# Patient Record
Sex: Male | Born: 1935 | Race: White | Hispanic: No | Marital: Married | Smoking: Never smoker
Health system: Southern US, Community
[De-identification: ages and names within clinical notes are randomized; demographics above are authoritative.]

## PROBLEM LIST (undated history)

## (undated) DIAGNOSIS — F0280 Dementia in other diseases classified elsewhere without behavioral disturbance: Secondary | ICD-10-CM

## (undated) DIAGNOSIS — E7800 Pure hypercholesterolemia, unspecified: Secondary | ICD-10-CM

## (undated) DIAGNOSIS — G629 Polyneuropathy, unspecified: Secondary | ICD-10-CM

## (undated) DIAGNOSIS — N400 Enlarged prostate without lower urinary tract symptoms: Secondary | ICD-10-CM

---

## 2019-07-15 ENCOUNTER — Ambulatory Visit (INDEPENDENT_AMBULATORY_CARE_PROVIDER_SITE_OTHER): Admit: 2019-07-15 | Discharge: 2019-07-15 | Disposition: A | Discharge status: Home / Self Care | Payer: Medicare HMO

## 2019-07-15 ENCOUNTER — Ambulatory Visit
Admission: EM | Admit: 2019-07-15 | Discharge: 2019-07-15 | Disposition: A | Discharge status: Home / Self Care | Payer: Medicare HMO | Attending: Family Medicine | Admitting: Family Medicine

## 2019-07-15 ENCOUNTER — Other Ambulatory Visit: Payer: Self-pay

## 2019-07-15 ENCOUNTER — Encounter: Payer: Self-pay | Admitting: Emergency Medicine

## 2019-07-15 DIAGNOSIS — S0181XA Laceration without foreign body of other part of head, initial encounter: Secondary | ICD-10-CM | POA: Diagnosis not present

## 2019-07-15 DIAGNOSIS — W19XXXA Unspecified fall, initial encounter: Secondary | ICD-10-CM

## 2019-07-15 DIAGNOSIS — S0083XA Contusion of other part of head, initial encounter: Secondary | ICD-10-CM | POA: Diagnosis not present

## 2019-07-15 DIAGNOSIS — S01111A Laceration without foreign body of right eyelid and periocular area, initial encounter: Secondary | ICD-10-CM

## 2019-07-15 HISTORY — DX: Benign prostatic hyperplasia without lower urinary tract symptoms: N40.0

## 2019-07-15 HISTORY — DX: Polyneuropathy, unspecified: G62.9

## 2019-07-15 HISTORY — DX: Dementia in other diseases classified elsewhere, unspecified severity, without behavioral disturbance, psychotic disturbance, mood disturbance, and anxiety: F02.80

## 2019-07-15 HISTORY — DX: Pure hypercholesterolemia, unspecified: E78.00

## 2019-07-15 MED ORDER — TETANUS-DIPHTH-ACELL PERTUSSIS 5-2.5-18.5 LF-MCG/0.5 IM SUSP
0.5000 mL | Freq: Once | INTRAMUSCULAR | Status: AC
  Administered 2019-07-15: 0.5 mL via INTRAMUSCULAR

## 2019-07-15 MED ORDER — AMOXICILLIN 875 MG PO TABS: 875.0000 mg | ORAL_TABLET | Freq: Two times a day (BID) | ORAL | 0 refills | Status: DC

## 2019-07-15 NOTE — ED Provider Notes (Signed)
[facility]    CSN: [id number] Arrival date & time: 07/15/19  1840      History   Chief Complaint Chief Complaint  Patient presents with   Facial Laceration    HPI Jeffrey Burke is a 84 y.o. male.   84 yo male accompanied by son with a c/o fall this afternoon at home in the driveway hitting his forehead and face and sustaining a laceration to his right eyebrow area and right upper cheek area.  Denies loss of consciousness, vision changes, vomiting, numbness/tingling, unilateral weakness, neck pain.      Past Medical History:  Diagnosis Date   Alzheimer disease (HCC)    BPH (benign prostatic hyperplasia)    Hypercholesteremia    Neuropathy     There are no active problems to display for this patient.   History reviewed. No pertinent surgical history.     Home Medications    Prior to Admission medications   Medication Sig Start Date End Date Taking? Authorizing Provider  doxazosin (CARDURA) 4 MG tablet Take 4 mg by mouth at bedtime. 06/11/19  Yes [provider]  DULoxetine (CYMBALTA) 30 MG capsule Take 30 mg by mouth daily. 03/06/19  Yes [provider]  finasteride (PROSCAR) 5 MG tablet Take 5 mg by mouth daily. 06/11/19  Yes [provider]  gabapentin (NEURONTIN) 100 MG capsule Take 100 mg by mouth at bedtime. 06/11/19  Yes [provider]  lovastatin (MEVACOR) 40 MG tablet Take 40 mg by mouth at bedtime. 06/11/19  Yes [provider]  memantine (NAMENDA) 10 MG tablet Take 10 mg by mouth daily. 06/11/19  Yes [provider]  QUEtiapine (SEROQUEL) 25 MG tablet Take 25 mg by mouth at bedtime. 07/06/19  Yes [provider]  amoxicillin (AMOXIL) 875 MG tablet Take 1 tablet (875 mg total) by mouth 2 (two) times daily. 07/15/19   Payton Mccallumonty, Christianne Zacher, MD    Family History History reviewed. No pertinent family history.  Social History Social History   Tobacco Use   Smoking status: Never Smoker    Smokeless tobacco: Never Used  Substance Use Topics   Alcohol use: Never    Frequency: Never   Drug use: Never     Allergies   Patient has no known allergies.   Review of Systems Review of Systems   Physical Exam Triage Vital Signs ED Triage Vitals  Enc Vitals Group     BP 07/15/19 1904 (!) 161/71     Pulse Rate 07/15/19 1904 72     Resp 07/15/19 1904 18     Temp 07/15/19 1904 98.2 F (36.8 C)     Temp Source 07/15/19 1904 Oral     SpO2 07/15/19 1904 99 %     Weight 07/15/19 1901 155 lb (70.3 kg)     Height 07/15/19 1901 5\' 8"  (1.727 m)     Head Circumference --      Peak Flow --      Pain Score 07/15/19 1900 2     Pain Loc --      Pain Edu? --      Excl. in GC? --    No data found.  Updated Vital Signs BP (!) 161/71 (BP Location: Left Arm)    Pulse 72    Temp 98.2 F (36.8 C) (Oral)    Resp 18    Ht 5\' 8"  (1.727 m)    Wt 70.3 kg    SpO2 99%    BMI 23.57  kg/m   Visual Acuity Right Eye Distance:   Left Eye Distance:   Bilateral Distance:    Right Eye Near:   Left Eye Near:    Bilateral Near:     Physical Exam Vitals signs and nursing note reviewed.  Constitutional:      General: He is not in acute distress.    Appearance: He is not toxic-appearing or diaphoretic.  HENT:     Head: Abrasion (to left forehead and cheek) and laceration present.      Right Ear: Tympanic membrane, ear canal and external ear normal.     Left Ear: Tympanic membrane, ear canal and external ear normal.     Nose: Nose normal.     Mouth/Throat:     Pharynx: Oropharynx is clear.  Eyes:     General: Gaze aligned appropriately.     Extraocular Movements: Extraocular movements intact.     Pupils: Pupils are equal, round, and reactive to light.  Neck:     Musculoskeletal: Normal range of motion and neck supple. No muscular tenderness.  Musculoskeletal:     Right shoulder: Normal.     Left shoulder: Normal.     Comments: Superficial skin abrasions noted to shoulders  bilaterally; extremities neurovascularly intact  Neurological:     General: No focal deficit present.     Mental Status: He is alert. Mental status is at baseline.     Cranial Nerves: Cranial nerves are intact.     Motor: Motor function is intact.     Deep Tendon Reflexes: Reflexes are normal and symmetric.      UC Treatments / Results  Labs (all labs ordered are listed, but only abnormal results are displayed) Labs Reviewed - No data to display  EKG   Radiology Ct Head Wo Contrast  Result Date: 07/15/2019 CLINICAL DATA:  Fall with head and facial trauma. EXAM: CT HEAD WITHOUT CONTRAST CT MAXILLOFACIAL WITHOUT CONTRAST TECHNIQUE: Multidetector CT imaging of the head and maxillofacial structures were performed using the standard protocol without intravenous contrast. Multiplanar CT image reconstructions of the maxillofacial structures were also generated. COMPARISON:  None. FINDINGS: CT HEAD FINDINGS Brain: There is no evidence of acute infarct, intracranial hemorrhage, mass, midline shift, or extra-axial fluid collection. Mild cerebral atrophy is within normal limits for age. Vascular: Calcified atherosclerosis at the skull base. No hyperdense vessel. Skull: No fracture or focal osseous lesion. Other: None. CT MAXILLOFACIAL FINDINGS Osseous: No acute fracture or mandibular dislocation. Moderately advanced median C1-2 arthropathy. Moderate to severe multilevel cervical facet arthrosis with trace anterolisthesis of C5 on C6. Left facet ankylosis at C2-3. Orbits: Bilateral cataract extraction. Small right periorbital hematoma. No retrobulbar hematoma. Sinuses: Paranasal sinuses and mastoid air cells are clear. Soft tissues: Mild carotid atherosclerosis. IMPRESSION: 1. No evidence of acute intracranial abnormality. 2. Small right periorbital hematoma. No maxillofacial fracture. Electronically Signed   By: Sebastian Ache M.D.   On: 07/15/2019 20:27   Ct Maxillofacial Wo Contrast  Result Date:  07/15/2019 CLINICAL DATA:  Fall with head and facial trauma. EXAM: CT HEAD WITHOUT CONTRAST CT MAXILLOFACIAL WITHOUT CONTRAST TECHNIQUE: Multidetector CT imaging of the head and maxillofacial structures were performed using the standard protocol without intravenous contrast. Multiplanar CT image reconstructions of the maxillofacial structures were also generated. COMPARISON:  None. FINDINGS: CT HEAD FINDINGS Brain: There is no evidence of acute infarct, intracranial hemorrhage, mass, midline shift, or extra-axial fluid collection. Mild cerebral atrophy is within normal limits for age. Vascular: Calcified atherosclerosis at the skull  base. No hyperdense vessel. Skull: No fracture or focal osseous lesion. Other: None. CT MAXILLOFACIAL FINDINGS Osseous: No acute fracture or mandibular dislocation. Moderately advanced median C1-2 arthropathy. Moderate to severe multilevel cervical facet arthrosis with trace anterolisthesis of C5 on C6. Left facet ankylosis at C2-3. Orbits: Bilateral cataract extraction. Small right periorbital hematoma. No retrobulbar hematoma. Sinuses: Paranasal sinuses and mastoid air cells are clear. Soft tissues: Mild carotid atherosclerosis. IMPRESSION: 1. No evidence of acute intracranial abnormality. 2. Small right periorbital hematoma. No maxillofacial fracture. Electronically Signed   By: Logan Bores M.D.   On: 07/15/2019 20:27    Procedures Laceration Repair  Date/Time: 07/15/2019 7:55 PM Performed by: Norval Gable, MD Authorized by: Norval Gable, MD   Consent:    Consent obtained:  Verbal   Consent given by:  Patient   Risks discussed:  Infection, need for additional repair, nerve damage, poor wound healing, poor cosmetic result, pain, retained foreign body, tendon damage and vascular damage   Alternatives discussed:  No treatment Anesthesia (see MAR for exact dosages):    Anesthesia method:  Topical application and local infiltration   Topical anesthetic:  LET   Local  anesthetic:  Lidocaine 1% w/o epi Laceration details:    Location:  Face   Face location:  R eyebrow (right eyebrow and right upper lateral cheek)   Length (cm):  1 Repair type:    Repair type:  Simple Pre-procedure details:    Preparation:  Patient was prepped and draped in usual sterile fashion Exploration:    Hemostasis achieved with:  LET and direct pressure   Wound exploration: wound explored through full range of motion and entire depth of wound probed and visualized     Wound extent: areolar tissue violated     Contaminated: yes   Treatment:    Area cleansed with:  Saline and Hibiclens   Amount of cleaning:  Extensive   Irrigation solution:  Sterile water   Irrigation method:  Syringe   Visualized foreign bodies/material removed: yes   Skin repair:    Repair method:  Sutures   Suture size:  6-0   Suture material:  Nylon   Number of sutures:  5 Approximation:    Approximation:  Close Post-procedure details:    Dressing:  Antibiotic ointment and non-adherent dressing   Patient tolerance of procedure:  Tolerated well, no immediate complications   (including critical care time)  Medications Ordered in UC Medications  Tdap (BOOSTRIX) injection 0.5 mL (0.5 mLs Intramuscular Given 07/15/19 1912)    Initial Impression / Assessment and Plan / UC Course  I have reviewed the triage vital signs and the nursing notes.  Pertinent labs & imaging results that were available during my care of the patient were reviewed by me and considered in my medical decision making (see chart for details).      Final Clinical Impressions(s) / UC Diagnoses   Final diagnoses:  Contusion of face, initial encounter  Laceration of right eyebrow, initial encounter  Facial laceration, initial encounter  Fall, initial encounter     Discharge Instructions     Follow in 7 days for suture removal or sooner as needed if any problems    ED Prescriptions    Medication Sig Dispense Auth.  Provider   amoxicillin (AMOXIL) 875 MG tablet Take 1 tablet (875 mg total) by mouth 2 (two) times daily. 14 tablet Norval Gable, MD     1. x-ray results and diagnosis reviewed with patient and son 2. rx  as per orders above; reviewed possible side effects, interactions, risks and benefits  3. Recommend supportive treatment with routine wound, rest, otc analgesics as needed 4. Tetanus vaccine given 5. Follow-up in 7 days for suture removal or sooner prn if any problems   PDMP not reviewed this encounter.   Payton Mccallum, MD 07/15/19 2051

## 2019-07-15 NOTE — ED Triage Notes (Addendum)
Patient c/o falling this afternoon around 3pm in the driveway. He hit his face. Laceration to right upper eye x 2, right upper cheek. He also has bruising to his left cheek and right eye area. He denies LOC. Patient has 4 skin tears to his left arm. Patient also has abrasion to his left shoulder and c/o pain in the shoulder area. He also has an abrasion to his upper right shoulder.

## 2019-07-15 NOTE — Discharge Instructions (Signed)
Follow in 7 days for suture removal or sooner as needed if any problems

## 2019-07-21 ENCOUNTER — Telehealth: Payer: Self-pay | Admitting: Emergency Medicine

## 2019-07-21 DIAGNOSIS — I10 Essential (primary) hypertension: Secondary | ICD-10-CM | POA: Diagnosis present

## 2019-07-21 NOTE — Telephone Encounter (Signed)
Prior Authorization for CT Maxillofacial and CT Brain/Head is [id number]

## 2019-07-22 ENCOUNTER — Ambulatory Visit
Admission: EM | Admit: 2019-07-22 | Discharge: 2019-07-22 | Disposition: A | Discharge status: Home / Self Care | Payer: Medicare HMO | Attending: Family Medicine | Admitting: Family Medicine

## 2019-07-22 ENCOUNTER — Other Ambulatory Visit: Payer: Self-pay

## 2019-07-22 DIAGNOSIS — Z4802 Encounter for removal of sutures: Secondary | ICD-10-CM | POA: Diagnosis not present

## 2019-07-22 NOTE — ED Triage Notes (Signed)
Pt. States he was told to come back in a week to get his suture removed. He has no complaints today.

## 2019-07-22 NOTE — ED Provider Notes (Signed)
[facility] ____________________________________________  Time seen: Approximately 3:39 PM  I have reviewed the triage vital signs and the nursing notes.   HISTORY  Chief Complaint Suture / Staple Removal   HPI Jeffrey Burke is a 84 y.o. male presenting with son at bedside for evaluation for suture removal.  Patient was seen in urgent care on 07/15/19 and had 5 stitches placed to your face, 3 in eyebrow and 2 to cheek.  Denies fevers.  Reports healing well, no drainage, denies pain.  States here for suture removal only.   Past Medical History:  Diagnosis Date  . Alzheimer disease (HCC)   . BPH (benign prostatic hyperplasia)   . Hypercholesteremia   . Neuropathy     There are no active problems to display for this patient.   History reviewed. No pertinent surgical history.   No current facility-administered medications for this encounter.   Current Outpatient Medications:  .  amoxicillin (AMOXIL) 875 MG tablet, Take 1 tablet (875 mg total) by mouth 2 (two) times daily., Disp: 14 tablet, Rfl: 0 .  doxazosin (CARDURA) 4 MG tablet, Take 4 mg by mouth at bedtime., Disp: , Rfl:  .  DULoxetine (CYMBALTA) 30 MG capsule, Take 30 mg by mouth daily., Disp: , Rfl:  .  finasteride (PROSCAR) 5 MG tablet, Take 5 mg by mouth daily., Disp: , Rfl:  .  gabapentin (NEURONTIN) 100 MG capsule, Take 100 mg by mouth at bedtime., Disp: , Rfl:  .  lovastatin (MEVACOR) 40 MG tablet, Take 40 mg by mouth at bedtime., Disp: , Rfl:  .  memantine (NAMENDA) 10 MG tablet, Take 10 mg by mouth daily., Disp: , Rfl:  .  QUEtiapine (SEROQUEL) 25 MG tablet, Take 25 mg by mouth at bedtime., Disp: , Rfl:   Allergies Patient has no known allergies.  Family History  Problem Relation Age of Onset  . Stroke Mother   . Heart failure Father     Social History Social History   Tobacco Use  . Smoking status: Never Smoker  . Smokeless tobacco: Never Used  Substance Use Topics  . Alcohol use:  Never    Frequency: Never  . Drug use: Never    Review of Systems Constitutional: No fever/chills Skin: Suture removal. ____________________________________________   PHYSICAL EXAM:  VITAL SIGNS: ED Triage Vitals  Enc Vitals Group     BP 07/22/19 1514 (!) 135/104     Pulse Rate 07/22/19 1514 75     Resp 07/22/19 1514 17     Temp 07/22/19 1514 98 F (36.7 C)     Temp Source 07/22/19 1514 Oral     SpO2 07/22/19 1514 98 %     Weight 07/22/19 1513 155 lb (70.3 kg)     Height --      Head Circumference --      Peak Flow --      Pain Score 07/22/19 1513 0     Pain Loc --      Pain Edu? --      Excl. in GC? --     Constitutional: Alert and oriented. Well appearing and in no acute distress. Eyes: Conjunctivae are normal.  Musculoskeletal: Steady gait. Neurologic:  Normal speech and language.  Skin:  Skin is warm, dry.  Except: Right eyebrow 3 sutures present with well approximated wound, no erythema, no drainage.  Right cheek 2 sutures present with well approximated wound, no erythema, no drainage. Psychiatric: Mood and affect are normal. Speech and behavior are  normal. Patient exhibits appropriate insight and judgment   ___________________________________________   LABS (all labs ordered are listed, but only abnormal results are displayed)  Labs Reviewed - No data to display  PROCEDURES Procedures   Suture removal Procedure explained and consent obtained by patient. 5 total sutures removed by myself after cleaning with Betadine.  Wound well approximated.  No dehiscence.  Patient tolerated well.  INITIAL IMPRESSION / ASSESSMENT AND PLAN / ED COURSE  Pertinent labs & imaging results that were available during my care of the patient were reviewed by me and considered in my medical decision making (see chart for details).  Well-appearing patient.  Suture removal.  Sutures removed.  Continue to monitor and supportive care.   ____________________________________________   FINAL CLINICAL IMPRESSION(S) / ED DIAGNOSES  Final diagnoses:  Visit for suture removal     ED Discharge Orders    None       Note: This dictation was prepared with Dragon dictation along with smaller phrase technology. Any transcriptional errors that result from this process are unintentional.         Marylene Land, NP 07/22/19 1543

## 2019-07-22 NOTE — ED Notes (Signed)
5 sutures removed from patients forehead by Mendel Ryder NP.

## 2020-05-09 DIAGNOSIS — H903 Sensorineural hearing loss, bilateral: Secondary | ICD-10-CM | POA: Diagnosis present

## 2021-02-23 IMAGING — CT CT HEAD W/O CM
1 series · 15 of 30 positions shown, 19 images · non-contrast
Comparison: None.

CLINICAL DATA: Fall with head and facial trauma.

EXAM:
CT HEAD WITHOUT CONTRAST
CT MAXILLOFACIAL WITHOUT CONTRAST
TECHNIQUE: Multidetector CT imaging of the head and maxillofacial structures
were performed using the standard protocol without intravenous
contrast. Multiplanar CT image reconstructions of the maxillofacial
structures were also generated.

[Series 2: head wo · axial · 0.49mm/px · z∈[-212,-67]mm · 15 of 33 slices shown, 19 images]
[im 2/33  brain]
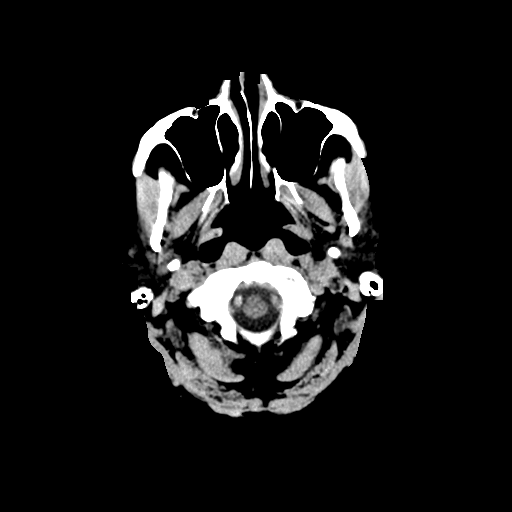
[im 2/33  bone]
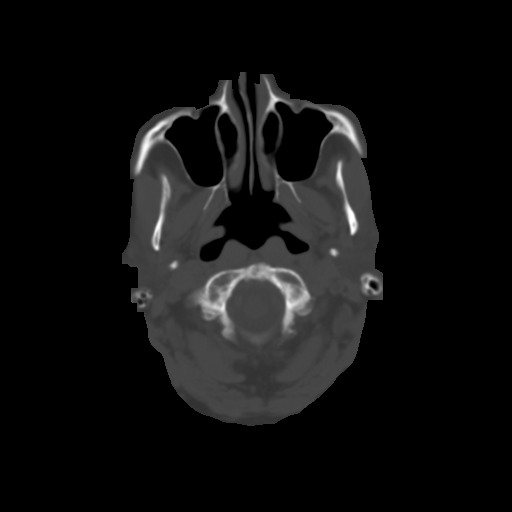
[im 4/33  brain]
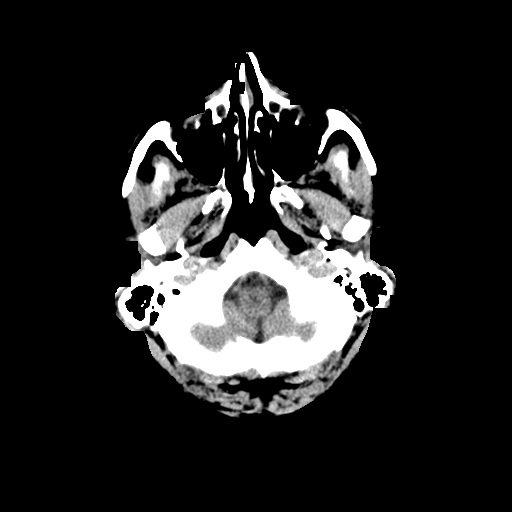
[im 6/33  brain]
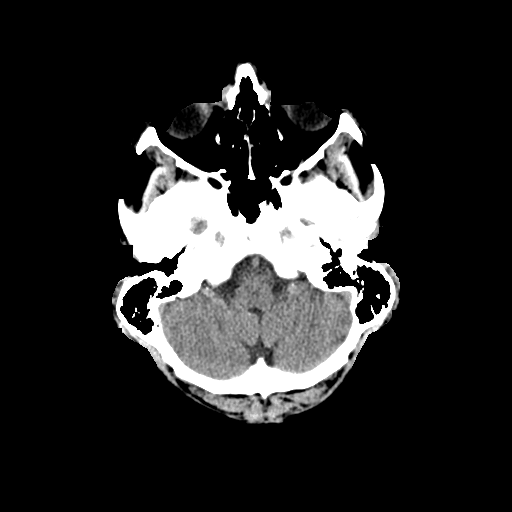
[im 8/33  brain]
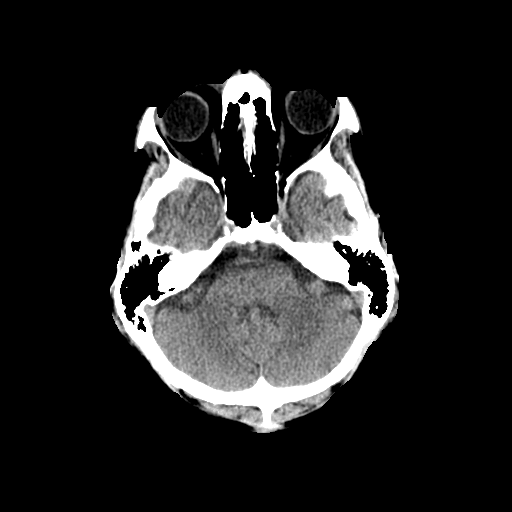
[im 10/33  brain]
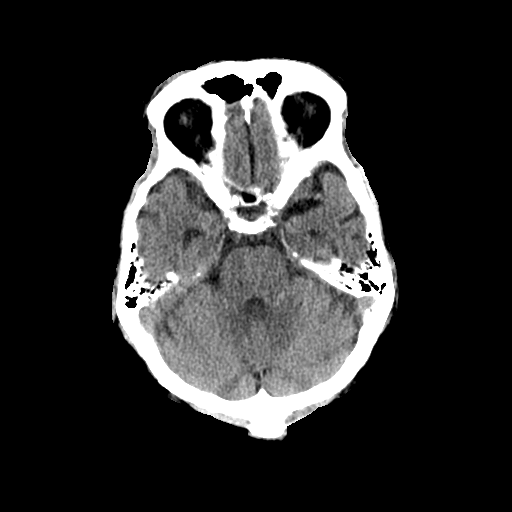
[im 10/33  bone]
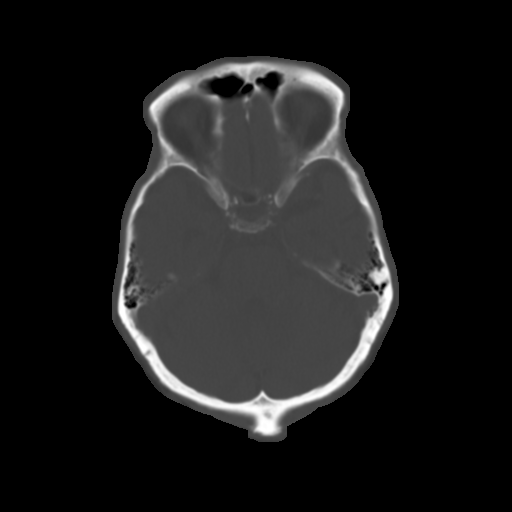
[im 13/33  brain]
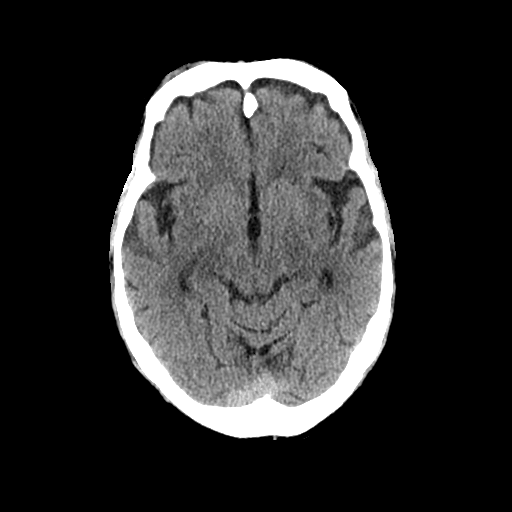
[im 15/33  brain]
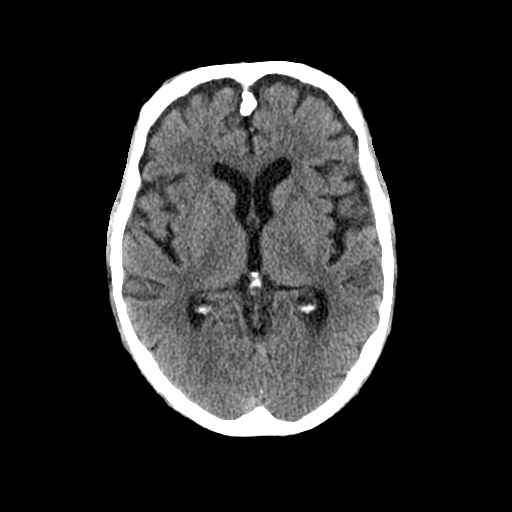
[im 17/33  brain]
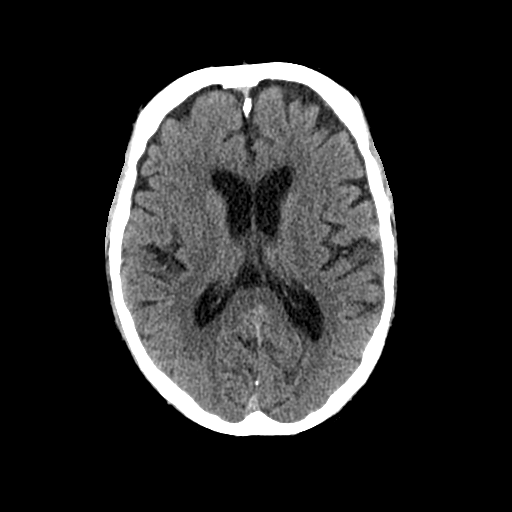
[im 18/33  brain]
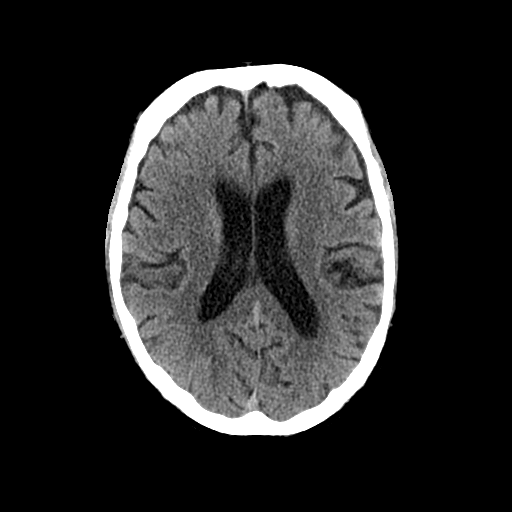
[im 18/33  bone]
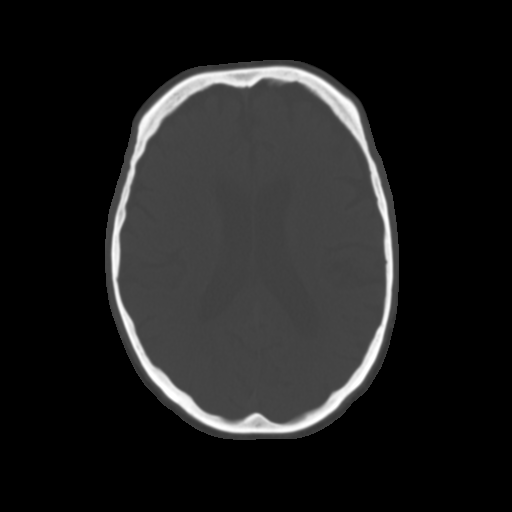
[im 20/33  brain]
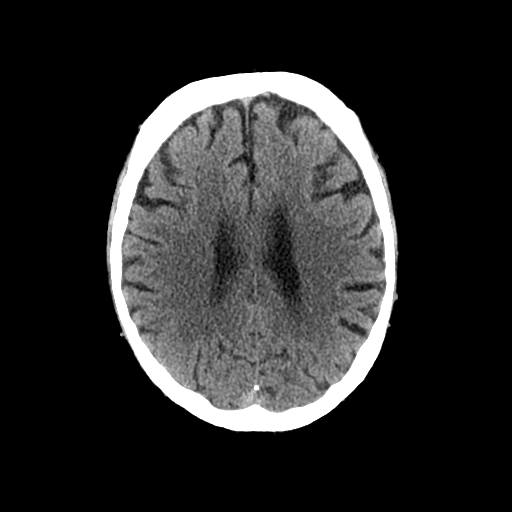
[im 23/33  brain]
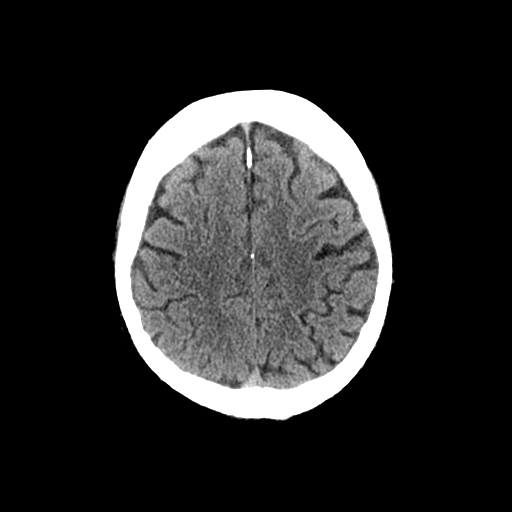
[im 25/33  brain]
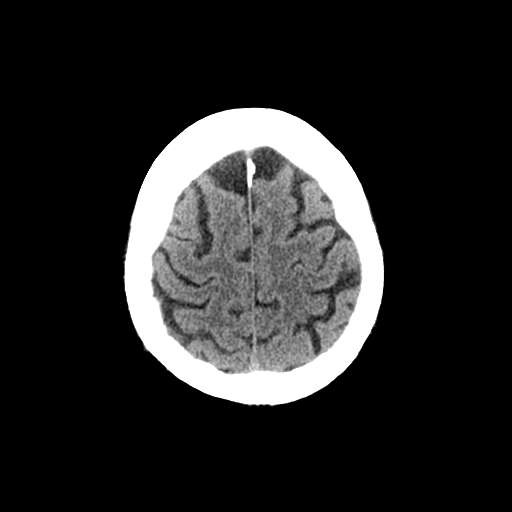
[im 27/33  brain]
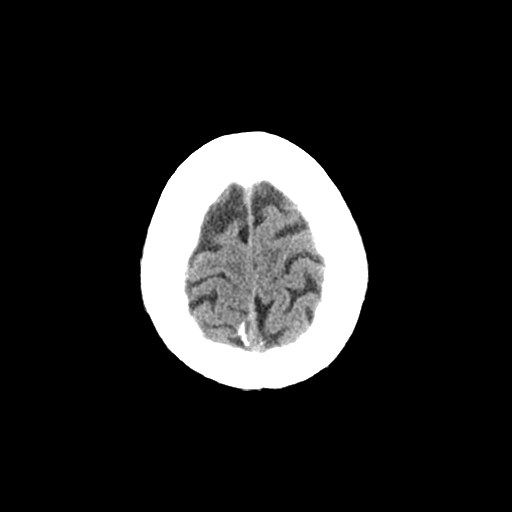
[im 27/33  bone]
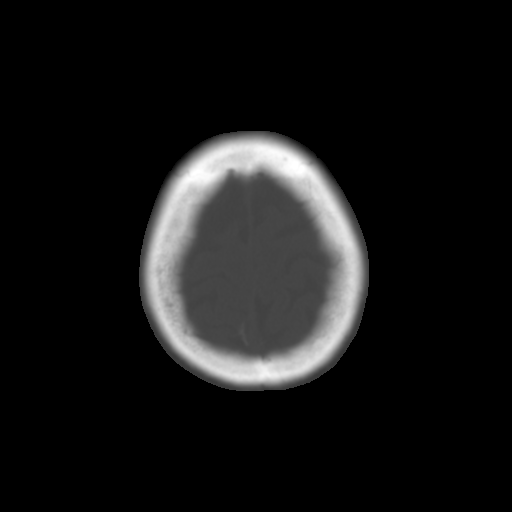
[im 29/33  brain]
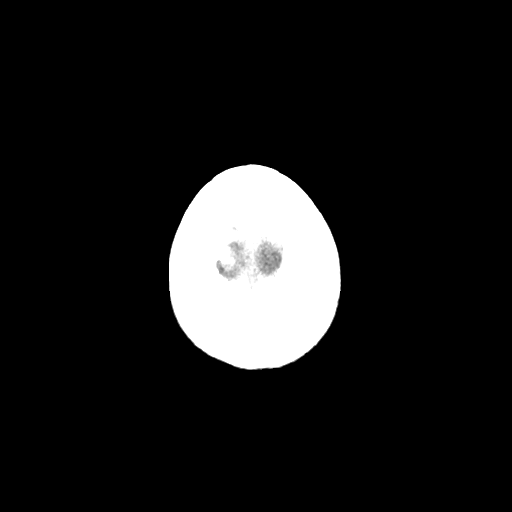
[im 31/33  brain]
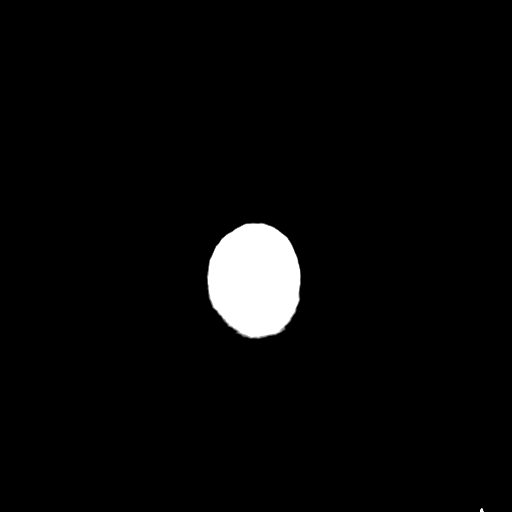

[15 of 30 positions shown; findings below may reference images not displayed]

FINDINGS: CT HEAD FINDINGS

Brain: There is no evidence of acute infarct, intracranial
hemorrhage, mass, midline shift, or extra-axial fluid collection.
Mild cerebral atrophy is within normal limits for age.

Vascular: Calcified atherosclerosis at the skull base. No hyperdense
vessel.

Skull: No fracture or focal osseous lesion.

Other: None.

CT MAXILLOFACIAL FINDINGS

Osseous: No acute fracture or mandibular dislocation. Moderately
advanced median C1-2 arthropathy. Moderate to severe multilevel
cervical facet arthrosis with trace anterolisthesis of C5 on C6.
Left facet ankylosis at C2-3.

Orbits: Bilateral cataract extraction. Small right periorbital
hematoma. No retrobulbar hematoma.

Sinuses: Paranasal sinuses and mastoid air cells are clear.

Soft tissues: Mild carotid atherosclerosis.
IMPRESSION: 1. No evidence of acute intracranial abnormality.
2. Small right periorbital hematoma. No maxillofacial fracture.

## 2023-01-30 ENCOUNTER — Emergency Department: Payer: Medicare HMO

## 2023-01-30 ENCOUNTER — Other Ambulatory Visit: Payer: Self-pay

## 2023-01-30 ENCOUNTER — Inpatient Hospital Stay
Admission: EM | Admit: 2023-01-30 | Discharge: 2023-02-02 | DRG: 177 | Disposition: A | Discharge status: Home Health | Payer: Medicare HMO | Attending: Obstetrics and Gynecology | Admitting: Obstetrics and Gynecology

## 2023-01-30 DIAGNOSIS — U071 COVID-19: Principal | ICD-10-CM

## 2023-01-30 DIAGNOSIS — G309 Alzheimer's disease, unspecified: Secondary | ICD-10-CM | POA: Diagnosis present

## 2023-01-30 DIAGNOSIS — R310 Gross hematuria: Secondary | ICD-10-CM | POA: Insufficient documentation

## 2023-01-30 DIAGNOSIS — N401 Enlarged prostate with lower urinary tract symptoms: Secondary | ICD-10-CM | POA: Diagnosis present

## 2023-01-30 DIAGNOSIS — R531 Weakness: Principal | ICD-10-CM

## 2023-01-30 DIAGNOSIS — Z8249 Family history of ischemic heart disease and other diseases of the circulatory system: Secondary | ICD-10-CM

## 2023-01-30 DIAGNOSIS — N179 Acute kidney failure, unspecified: Secondary | ICD-10-CM | POA: Insufficient documentation

## 2023-01-30 DIAGNOSIS — H903 Sensorineural hearing loss, bilateral: Secondary | ICD-10-CM | POA: Diagnosis present

## 2023-01-30 DIAGNOSIS — Z823 Family history of stroke: Secondary | ICD-10-CM

## 2023-01-30 DIAGNOSIS — I10 Essential (primary) hypertension: Secondary | ICD-10-CM | POA: Diagnosis present

## 2023-01-30 DIAGNOSIS — E7800 Pure hypercholesterolemia, unspecified: Secondary | ICD-10-CM | POA: Diagnosis present

## 2023-01-30 DIAGNOSIS — F0280 Dementia in other diseases classified elsewhere without behavioral disturbance: Secondary | ICD-10-CM | POA: Insufficient documentation

## 2023-01-30 DIAGNOSIS — R338 Other retention of urine: Secondary | ICD-10-CM

## 2023-01-30 DIAGNOSIS — Z79899 Other long term (current) drug therapy: Secondary | ICD-10-CM

## 2023-01-30 DIAGNOSIS — N400 Enlarged prostate without lower urinary tract symptoms: Secondary | ICD-10-CM | POA: Insufficient documentation

## 2023-01-30 DIAGNOSIS — E860 Dehydration: Secondary | ICD-10-CM | POA: Diagnosis present

## 2023-01-30 DIAGNOSIS — G9341 Metabolic encephalopathy: Secondary | ICD-10-CM

## 2023-01-30 DIAGNOSIS — G629 Polyneuropathy, unspecified: Secondary | ICD-10-CM | POA: Diagnosis present

## 2023-01-30 LAB — PROTIME-INR
INR: 1.1 (ref 0.8–1.2)
Prothrombin Time: 14 seconds (ref 11.4–15.2)

## 2023-01-30 LAB — DIFFERENTIAL
Abs Immature Granulocytes: 0.03 10*3/uL (ref 0.00–0.07)
Basophils Absolute: 0 10*3/uL (ref 0.0–0.1)
Basophils Relative: 1 %
Eosinophils Absolute: 0.1 10*3/uL (ref 0.0–0.5)
Eosinophils Relative: 1 %
Immature Granulocytes: 1 %
Lymphocytes Relative: 10 %
Lymphs Abs: 0.6 10*3/uL — ABNORMAL LOW (ref 0.7–4.0)
Monocytes Absolute: 0.9 10*3/uL (ref 0.1–1.0)
Monocytes Relative: 15 %
Neutro Abs: 4.3 10*3/uL (ref 1.7–7.7)
Neutrophils Relative %: 72 %

## 2023-01-30 LAB — CBC
HCT: 40.3 % (ref 39.0–52.0)
Hemoglobin: 13.2 g/dL (ref 13.0–17.0)
MCH: 29.7 pg (ref 26.0–34.0)
MCHC: 32.8 g/dL (ref 30.0–36.0)
MCV: 90.8 fL (ref 80.0–100.0)
Platelets: 175 10*3/uL (ref 150–400)
RBC: 4.44 MIL/uL (ref 4.22–5.81)
RDW: 12.5 % (ref 11.5–15.5)
WBC: 6 10*3/uL (ref 4.0–10.5)
nRBC: 0 % (ref 0.0–0.2)

## 2023-01-30 LAB — CBG MONITORING, ED: Glucose-Capillary: 93 mg/dL (ref 70–99)

## 2023-01-30 LAB — COMPREHENSIVE METABOLIC PANEL
ALT: 17 U/L (ref 0–44)
AST: 22 U/L (ref 15–41)
Albumin: 3.9 g/dL (ref 3.5–5.0)
Alkaline Phosphatase: 60 U/L (ref 38–126)
Anion gap: 6 (ref 5–15)
BUN: 25 mg/dL — ABNORMAL HIGH (ref 8–23)
CO2: 23 mmol/L (ref 22–32)
Calcium: 7.9 mg/dL — ABNORMAL LOW (ref 8.9–10.3)
Chloride: 105 mmol/L (ref 98–111)
Creatinine, Ser: 1.26 mg/dL — ABNORMAL HIGH (ref 0.61–1.24)
GFR, Estimated: 56 mL/min — ABNORMAL LOW (ref >60)
Glucose, Bld: 95 mg/dL (ref 70–99)
Potassium: 4 mmol/L (ref 3.5–5.1)
Sodium: 134 mmol/L — ABNORMAL LOW (ref 135–145)
Total Bilirubin: 0.7 mg/dL (ref 0.3–1.2)
Total Protein: 6.4 g/dL — ABNORMAL LOW (ref 6.5–8.1)

## 2023-01-30 LAB — APTT: aPTT: 29 seconds (ref 24–36)

## 2023-01-30 LAB — ETHANOL: Alcohol, Ethyl (B): <10 mg/dL (ref <10)

## 2023-01-30 MED ORDER — SODIUM CHLORIDE 0.9% FLUSH
3.0000 mL | Freq: Once | INTRAVENOUS | Status: AC
  Administered 2023-01-30: 3 mL via INTRAVENOUS

## 2023-01-30 MED ORDER — LACTATED RINGERS IV BOLUS
1000.0000 mL | Freq: Once | INTRAVENOUS | Status: AC
  Administered 2023-01-30: 1000 mL via INTRAVENOUS

## 2023-01-30 NOTE — ED Provider Notes (Signed)
[facility] Provider Note    Event Date/Time   First MD Initiated Contact with Patient 01/30/23 2309     (approximate)   History   Weakness   HPI  Jeffrey Burke is a 88 y.o. male past medical history of Alzheimer's dementia, BPH, hyperlipidemia who presents with weakness.  Patient is accompanied by his son who provides most of the history.  Apparently at baseline patient is ambulatory and last week was jogging.  This morning was speed walking around the house.  However this afternoon his aide noticed that he was not able to get up from his chair.  He also did not eat lunch.  When his son got home he was sitting in his chair and did not notice anything abnormal but when he tried to get him up he was very shaky not able to ambulate is not typical for him.  Does seem to be somewhat more confused than normal but his mental status does wax and wane.  Has not had any fevers chills.  Has not had much urine output today per his aide.  Does have a chronic cough this is not new today.  Patient denies any complaints.  Patient's son denies any difficulty with speech.     Past Medical History:  Diagnosis Date   Alzheimer disease (HCC)    BPH (benign prostatic hyperplasia)    Hypercholesteremia    Neuropathy     There are no problems to display for this patient.    Physical Exam  Triage Vital Signs: ED Triage Vitals  Enc Vitals Group     BP 01/30/23 1655 (!) 118/57     Pulse Rate 01/30/23 1655 84     Resp 01/30/23 1655 18     Temp 01/30/23 1658 98.7 F (37.1 C)     Temp Source 01/30/23 2252 Oral     SpO2 01/30/23 1657 95 %     Weight 01/30/23 1700 160 lb (72.6 kg)     Height 01/30/23 1700 5\' 7"  (1.702 m)     Head Circumference --      Peak Flow --      Pain Score 01/30/23 1700 0     Pain Loc --      Pain Edu? --      Excl. in GC? --     Most recent vital signs: Vitals:   01/31/23 0100 01/31/23 0230  BP: (!) 134/59 133/62  Pulse: 84 98  Resp: (!)  23 (!) 30  Temp:  99.1 F (37.3 C)  SpO2: 95% 93%     General: Awake, no distress.  CV:  Good peripheral perfusion.  Resp:  Normal effort.  Abd:  No distention.  Neuro:             Awake, Alert, Oriented x 1-patient did not know place or year Aox3, nml speech  PERRL, EOMI, face symmetric, nml tongue movement  5/5 strength in the BL upper and lower extremities  Sensation grossly intact in the BL upper and lower extremities  Patient unable to follow commands for finger-nose Patient requires assistance with ambulation because he is somewhat unsteady but is not grossly ataxic  Other:     ED Results / Procedures / Treatments  Labs (all labs ordered are listed, but only abnormal results are displayed) Labs Reviewed  RESP PANEL BY RT-PCR (RSV, FLU A&B, COVID)  RVPGX2 - Abnormal; Notable for the following components:      Result Value   SARS  Coronavirus 2 by RT PCR POSITIVE (*)    All other components within normal limits  DIFFERENTIAL - Abnormal; Notable for the following components:   Lymphs Abs 0.6 (*)    All other components within normal limits  COMPREHENSIVE METABOLIC PANEL - Abnormal; Notable for the following components:   Sodium 134 (*)    BUN 25 (*)    Creatinine, Ser 1.26 (*)    Calcium 7.9 (*)    Total Protein 6.4 (*)    GFR, Estimated 56 (*)    All other components within normal limits  URINALYSIS, ROUTINE W REFLEX MICROSCOPIC - Abnormal; Notable for the following components:   Color, Urine YELLOW (*)    APPearance CLEAR (*)    Hgb urine dipstick LARGE (*)    All other components within normal limits  CULTURE, BLOOD (ROUTINE X 2)  CULTURE, BLOOD (ROUTINE X 2)  URINE CULTURE  PROTIME-INR  APTT  CBC  ETHANOL  LACTIC ACID, PLASMA  PROCALCITONIN  LACTIC ACID, PLASMA  CBG MONITORING, ED  CBG MONITORING, ED     EKG  EKG reviewed interpreted myself shows sinus rhythm with rightward axis significant artifact limits interpretation, nonspecific ST  changes   RADIOLOGY I reviewed and interpreted the CT scan of the brain which does not show any acute intracranial process    PROCEDURES:  Critical Care performed: No  Procedures  The patient is on the cardiac monitor to evaluate for evidence of arrhythmia and/or significant heart rate changes.   MEDICATIONS ORDERED IN ED: Medications  sodium chloride flush (NS) 0.9 % injection 3 mL (3 mLs Intravenous Given 01/30/23 2313)  lactated ringers bolus 1,000 mL (0 mLs Intravenous Stopped 01/31/23 0051)     IMPRESSION / MDM / ASSESSMENT AND PLAN / ED COURSE  I reviewed the triage vital signs and the nursing notes.                              Patient's presentation is most consistent with acute presentation with potential threat to life or bodily function.  Differential diagnosis includes, but is not limited to, metabolic encephalopathy, infection such as UTI, pneumonia, viral illness, acute ischemic CVA, intracranial hemorrhage, medication side effect  The patient is a 88 year old male with history of dementia who presents today because of weakness and inability to ambulate.  His son is with him who notes that this morning he was quite ambulatory and in fact was speed walking around the house.  However sometime this afternoon his aide noticed that he was not able to get up from his chair and when patient's son got home and tried to get him out of the chair he was very shaky and unable to ambulate which is very new for him.  Has also not eaten lunch today and has not urinated since noon.  Patient's vital signs are notable for temp of 99.7.  Otherwise vitals are reassuring.  He looks well is nontoxic appearing.  He is alert and oriented x 1 which his son says this is baseline.  Neurologic exam appears nonfocal he has equal strength in bilateral upper and lower extremities face is symmetric cranial nerves are intact.  Did attempt to ambulate the patient and he is able to stand up with  assistance is somewhat unsteady but not grossly ataxic.  He was not able to follow commands for finger-nose-finger.  Labs are overall reassuring.  Creatinine 1.26 which is not far off his  baseline of around 1.  No leukocytosis.  CT head is negative for acute abnormality.  Overall my suspicion for acute CVA is low given the lack of focality on the exam.  Given his low-grade temp and decreased urine output we will give a bolus of fluid and check urinalysis.  If the urine is not suggestive of infection then I think you will likely need an MRI.   Rectal temp is notable for fever of 100.7.  Bladder scan showed 450 cc of urine in the bladder.  Foley catheter was placed as patient was not able to urinate.  Initially there was significant hematuria when nursing attempted to place Foley.  On reattempt a small catheter they were able to pass catheter.  Urinalysis is greater than 50 red cells suspect this was related to traumatic Foley catheter placement.  There is no white cells.  I have sent a culture.  Lactate is negative.  Procalcitonin is also normal.  He is COVID-positive.  Chest x-ray is clear he is not hypoxic.  I suspect that his viral infection caused his acute weakness today.  Given he is not able to ambulate patient's family does not feel comfortable with him going home and I agree that he would benefit from observation and physical therapy.     FINAL CLINICAL IMPRESSION(S) / ED DIAGNOSES   Final diagnoses:  Weakness  COVID-19     Rx / DC Orders   ED Discharge Orders     None        Note:  This document was prepared using Dragon voice recognition software and may include unintentional dictation errors.   Georga Hacking, MD 01/31/23 [id number]

## 2023-01-30 NOTE — ED Provider Notes (Incomplete)
[facility] Provider Note    Event Date/Time   First MD Initiated Contact with Patient 01/30/23 2309     (approximate)   History   Weakness   HPI  Jeffrey Burke is a 88 y.o. male past medical history of Alzheimer's dementia, BPH, hyperlipidemia who presents with weakness.  Patient is accompanied by his son who provides most of the history.  Apparently at baseline patient is ambulatory and last week was jogging.  This morning was speed walking around the house.  However this afternoon his aide noticed that he was not able to get up from his chair.  He also did not eat lunch.  When his son got home he was sitting in his chair and did not notice anything abnormal but when he tried to get him up he was very shaky not able to ambulate is not typical for him.  Does seem to be somewhat more confused than normal but his mental status does wax and wane.  Has not had any fevers chills.  Has not had much urine output today per his aide.  Does have a chronic cough this is not new today.  Patient denies any complaints.  Patient's son denies any difficulty with speech.     Past Medical History:  Diagnosis Date  . Alzheimer disease (HCC)   . BPH (benign prostatic hyperplasia)   . Hypercholesteremia   . Neuropathy     There are no problems to display for this patient.    Physical Exam  Triage Vital Signs: ED Triage Vitals  Enc Vitals Group     BP 01/30/23 1655 (!) 118/57     Pulse Rate 01/30/23 1655 84     Resp 01/30/23 1655 18     Temp 01/30/23 1658 98.7 F (37.1 C)     Temp Source 01/30/23 2252 Oral     SpO2 01/30/23 1657 95 %     Weight 01/30/23 1700 160 lb (72.6 kg)     Height 01/30/23 1700 5\' 7"  (1.702 m)     Head Circumference --      Peak Flow --      Pain Score 01/30/23 1700 0     Pain Loc --      Pain Edu? --      Excl. in GC? --     Most recent vital signs: Vitals:   01/30/23 1658 01/30/23 2252  BP:  136/62  Pulse:  81  Resp:  20  Temp:  98.7 F (37.1 C) 99.7 F (37.6 C)  SpO2:  98%     General: Awake, no distress.  CV:  Good peripheral perfusion.  Resp:  Normal effort.  Abd:  No distention.  Neuro:             Awake, Alert, Oriented x 1-patient did not know place or year Aox3, nml speech  PERRL, EOMI, face symmetric, nml tongue movement  5/5 strength in the BL upper and lower extremities  Sensation grossly intact in the BL upper and lower extremities  Patient unable to follow commands for finger-nose Patient requires assistance with ambulation because he is somewhat unsteady but is not grossly ataxic  Other:     ED Results / Procedures / Treatments  Labs (all labs ordered are listed, but only abnormal results are displayed) Labs Reviewed  DIFFERENTIAL - Abnormal; Notable for the following components:      Result Value   Lymphs Abs 0.6 (*)    All other components  within normal limits  COMPREHENSIVE METABOLIC PANEL - Abnormal; Notable for the following components:   Sodium 134 (*)    BUN 25 (*)    Creatinine, Ser 1.26 (*)    Calcium 7.9 (*)    Total Protein 6.4 (*)    GFR, Estimated 56 (*)    All other components within normal limits  PROTIME-INR  APTT  CBC  ETHANOL  CBG MONITORING, ED  CBG MONITORING, ED     EKG  EKG reviewed interpreted myself shows sinus rhythm with rightward axis significant artifact limits interpretation, nonspecific ST changes   RADIOLOGY I reviewed and interpreted the CT scan of the brain which does not show any acute intracranial process    PROCEDURES:  Critical Care performed: No  Procedures  The patient is on the cardiac monitor to evaluate for evidence of arrhythmia and/or significant heart rate changes.   MEDICATIONS ORDERED IN ED: Medications  sodium chloride flush (NS) 0.9 % injection 3 mL (3 mLs Intravenous Given 01/30/23 2313)     IMPRESSION / MDM / ASSESSMENT AND PLAN / ED COURSE  I reviewed the triage vital signs and the nursing notes.                               Patient's presentation is most consistent with acute presentation with potential threat to life or bodily function.  Differential diagnosis includes, but is not limited to, metabolic encephalopathy, infection such as UTI, pneumonia, viral illness, acute ischemic CVA, intracranial hemorrhage, medication side effect  The patient is a 88 year old male with history of dementia who presents today because of weakness and inability to ambulate.  His son is with him who notes that this morning he was quite ambulatory and in fact was speed walking around the house.  However sometime this afternoon his aide noticed that he was not able to get up from his chair and when patient's son got home and tried to get him out of the chair he was very shaky and unable to ambulate which is very new for him.  Has also not eaten lunch today and has not urinated since noon.  Patient's vital signs are notable for temp of 99.7.  Otherwise vitals are reassuring.  He looks well is nontoxic appearing.  He is alert and oriented x 1 which his son says this is baseline.  Neurologic exam appears nonfocal he has equal strength in bilateral upper and lower extremities face is symmetric cranial nerves are intact.  Did attempt to ambulate the patient and he is able to stand up with assistance is somewhat unsteady but not grossly ataxic.  He was not able to follow commands for finger-nose-finger.  Labs are overall reassuring.  Creatinine 1.26 which is not far off his baseline of around 1.  No leukocytosis.  CT head is negative for acute abnormality.  Overall my suspicion for acute CVA is low given the lack of focality on the exam.  Given his low-grade temp and decreased urine output we will give a bolus of fluid and check urinalysis.  If the urine is not suggestive of infection then I think you will likely need an MRI.        FINAL CLINICAL IMPRESSION(S) / ED DIAGNOSES   Final diagnoses:  None     Rx / DC  Orders   ED Discharge Orders     None        Note:  This  document was prepared using Conservation officer, historic buildings and may include unintentional dictation errors.

## 2023-01-30 NOTE — ED Triage Notes (Signed)
Pt to ED with son for altered speech and difficulty walking, started at 1230 today. Hx of dementia. Pt with generalized weakness. Equal grip and strength.   Cbg 93

## 2023-01-30 NOTE — ED Notes (Signed)
Bladder scan performed: 450 ml

## 2023-01-30 NOTE — ED Provider Triage Note (Signed)
Emergency Medicine Provider Triage Evaluation Note  Jeffrey Burke , a 88 y.o. male  was evaluated in triage.  Pt complains of trouble walking. Brought in by son for evaluation. Son is unsure when last known well was. Son lives with him, patient was asleep when son left for work. Patient was ambulatory last night but complaining of leg pain. Son is unsure which side. Reports that he doesn't talk much but hasn't noticed a change in his speech. H/o alzheimers disease. At mental baseline per son. Son reports taht the caretaker side he was scooting himself on his bottom instead of walking since this morning.    Review of Systems  Positive: Patient denies complaints Negative: Pain, weakness  Physical Exam  BP (!) 118/57   Pulse 84   Resp 18  Gen:   Awake, no distress   Resp:  Normal effort  MSK:   Moves extremities without difficulty  Other:  Face symmetric, normal and equal strength in bilateral upper and lower extremities, negative pronator drift. No slurred speech  Medical Decision Making  Medically screening exam initiated at 4:56 PM.  Appropriate orders placed.  Eyob Worm was informed that the remainder of the evaluation will be completed by another provider, this initial triage assessment does not replace that evaluation, and the importance of remaining in the ED until their evaluation is complete.     Jackelyn Hoehn, PA-C 01/30/23 1704

## 2023-01-31 DIAGNOSIS — N400 Enlarged prostate without lower urinary tract symptoms: Secondary | ICD-10-CM | POA: Insufficient documentation

## 2023-01-31 DIAGNOSIS — G629 Polyneuropathy, unspecified: Secondary | ICD-10-CM | POA: Diagnosis present

## 2023-01-31 DIAGNOSIS — R338 Other retention of urine: Secondary | ICD-10-CM | POA: Diagnosis present

## 2023-01-31 DIAGNOSIS — F0280 Dementia in other diseases classified elsewhere without behavioral disturbance: Secondary | ICD-10-CM | POA: Insufficient documentation

## 2023-01-31 DIAGNOSIS — R531 Weakness: Secondary | ICD-10-CM

## 2023-01-31 DIAGNOSIS — Z8249 Family history of ischemic heart disease and other diseases of the circulatory system: Secondary | ICD-10-CM | POA: Diagnosis not present

## 2023-01-31 DIAGNOSIS — R310 Gross hematuria: Secondary | ICD-10-CM | POA: Diagnosis not present

## 2023-01-31 DIAGNOSIS — G9341 Metabolic encephalopathy: Secondary | ICD-10-CM | POA: Diagnosis present

## 2023-01-31 DIAGNOSIS — Z79899 Other long term (current) drug therapy: Secondary | ICD-10-CM | POA: Diagnosis not present

## 2023-01-31 DIAGNOSIS — U071 COVID-19: Secondary | ICD-10-CM

## 2023-01-31 DIAGNOSIS — N179 Acute kidney failure, unspecified: Secondary | ICD-10-CM | POA: Insufficient documentation

## 2023-01-31 DIAGNOSIS — I10 Essential (primary) hypertension: Secondary | ICD-10-CM | POA: Diagnosis present

## 2023-01-31 DIAGNOSIS — H903 Sensorineural hearing loss, bilateral: Secondary | ICD-10-CM | POA: Diagnosis present

## 2023-01-31 DIAGNOSIS — E7800 Pure hypercholesterolemia, unspecified: Secondary | ICD-10-CM | POA: Diagnosis present

## 2023-01-31 DIAGNOSIS — Z823 Family history of stroke: Secondary | ICD-10-CM | POA: Diagnosis not present

## 2023-01-31 DIAGNOSIS — G309 Alzheimer's disease, unspecified: Secondary | ICD-10-CM | POA: Diagnosis present

## 2023-01-31 DIAGNOSIS — N401 Enlarged prostate with lower urinary tract symptoms: Secondary | ICD-10-CM | POA: Diagnosis present

## 2023-01-31 DIAGNOSIS — E860 Dehydration: Secondary | ICD-10-CM | POA: Diagnosis present

## 2023-01-31 LAB — URINALYSIS, ROUTINE W REFLEX MICROSCOPIC
Bacteria, UA: NONE SEEN
Bilirubin Urine: NEGATIVE
Glucose, UA: NEGATIVE mg/dL
Ketones, ur: NEGATIVE mg/dL
Leukocytes,Ua: NEGATIVE
Nitrite: NEGATIVE
Protein, ur: NEGATIVE mg/dL
RBC / HPF: >50 RBC/hpf (ref 0–5)
Specific Gravity, Urine: 1.024 (ref 1.005–1.030)
Squamous Epithelial / HPF: NONE SEEN /HPF (ref 0–5)
pH: 5 (ref 5.0–8.0)

## 2023-01-31 LAB — RESP PANEL BY RT-PCR (RSV, FLU A&B, COVID)  RVPGX2
Influenza A by PCR: NEGATIVE
Influenza B by PCR: NEGATIVE
Resp Syncytial Virus by PCR: NEGATIVE
SARS Coronavirus 2 by RT PCR: POSITIVE — AB

## 2023-01-31 LAB — CULTURE, BLOOD (ROUTINE X 2): Culture: NO GROWTH

## 2023-01-31 LAB — PROCALCITONIN: Procalcitonin: <0.1 ng/mL

## 2023-01-31 LAB — LACTIC ACID, PLASMA
Lactic Acid, Venous: 1.3 mmol/L (ref 0.5–1.9)
Lactic Acid, Venous: 2.2 mmol/L (ref 0.5–1.9)

## 2023-01-31 LAB — TSH: TSH: 2.634 u[IU]/mL (ref 0.350–4.500)

## 2023-01-31 MED ORDER — PRAVASTATIN SODIUM 20 MG PO TABS
40.0000 mg | ORAL_TABLET | Freq: Every day | ORAL | Status: DC
  Administered 2023-02-01: 40 mg via ORAL
  Filled 2023-01-31 (×2): qty 2

## 2023-01-31 MED ORDER — ONDANSETRON HCL 4 MG PO TABS: 4.0000 mg | ORAL_TABLET | Freq: Four times a day (QID) | ORAL | Status: DC | PRN

## 2023-01-31 MED ORDER — ONDANSETRON HCL 4 MG/2ML IJ SOLN: 4.0000 mg | Freq: Four times a day (QID) | INTRAMUSCULAR | Status: DC | PRN

## 2023-01-31 MED ORDER — ACETAMINOPHEN 650 MG RE SUPP: 650.0000 mg | Freq: Four times a day (QID) | RECTAL | Status: DC | PRN

## 2023-01-31 MED ORDER — DOXAZOSIN MESYLATE 4 MG PO TABS
4.0000 mg | ORAL_TABLET | Freq: Every day | ORAL | Status: DC
  Administered 2023-01-31 – 2023-02-01 (×2): 4 mg via ORAL
  Filled 2023-01-31 (×3): qty 1

## 2023-01-31 MED ORDER — DULOXETINE HCL 20 MG PO CPEP
20.0000 mg | ORAL_CAPSULE | Freq: Every day | ORAL | Status: DC
  Administered 2023-01-31 – 2023-02-02 (×3): 20 mg via ORAL
  Filled 2023-01-31 (×3): qty 1

## 2023-01-31 MED ORDER — FINASTERIDE 5 MG PO TABS
5.0000 mg | ORAL_TABLET | Freq: Every day | ORAL | Status: DC
  Administered 2023-01-31 – 2023-02-02 (×3): 5 mg via ORAL
  Filled 2023-01-31 (×3): qty 1

## 2023-01-31 MED ORDER — ACETAMINOPHEN 325 MG PO TABS: 650.0000 mg | ORAL_TABLET | Freq: Four times a day (QID) | ORAL | Status: DC | PRN

## 2023-01-31 MED ORDER — SODIUM CHLORIDE 0.9 % IV SOLN: INTRAVENOUS | Status: DC

## 2023-01-31 MED ORDER — LISINOPRIL 5 MG PO TABS
5.0000 mg | ORAL_TABLET | Freq: Every day | ORAL | Status: DC
  Filled 2023-01-31: qty 1

## 2023-01-31 MED ORDER — MEMANTINE HCL 5 MG PO TABS
10.0000 mg | ORAL_TABLET | Freq: Every day | ORAL | Status: DC
  Administered 2023-01-31 – 2023-02-02 (×3): 10 mg via ORAL
  Filled 2023-01-31 (×3): qty 2

## 2023-01-31 MED ORDER — ENOXAPARIN SODIUM 40 MG/0.4ML IJ SOSY
40.0000 mg | PREFILLED_SYRINGE | INTRAMUSCULAR | Status: DC
  Administered 2023-01-31 – 2023-02-01 (×2): 40 mg via SUBCUTANEOUS
  Filled 2023-01-31 (×2): qty 0.4

## 2023-01-31 MED ORDER — PANTOPRAZOLE SODIUM 40 MG PO TBEC
40.0000 mg | DELAYED_RELEASE_TABLET | Freq: Every day | ORAL | Status: DC
  Administered 2023-01-31 – 2023-02-02 (×3): 40 mg via ORAL
  Filled 2023-01-31 (×3): qty 1

## 2023-01-31 NOTE — ED Notes (Signed)
Pt working with OT.

## 2023-01-31 NOTE — TOC Initial Note (Signed)
Transition of Care [facility]) - Initial/Assessment Note    Patient Details  Name: Jeffrey Burke MRN: [id number] Date of Birth: [birth year]  [facility] CM/SW Contact:    Darolyn Rua, LCSW Phone Number: 01/31/2023, 1:25 PM  Clinical Narrative:                  CSW spoke with patient's son Fayrene Fearing regarding discharge planning and recommendations for home health PT made by physical therapy evaluation.   Fayrene Fearing is in agreement with [facility] PT, reports no dme needs at home. States that preferred pharmacy is [facility] [address] and patient continues to see PCP Dr. Stanley Sink.   Fayrene Fearing reports patient has 24/7 arund the clock care, they have hired in home aids through [email or web address].   Reports no additional needs at this time, aware referral was given to [facility]. They will reach out once patient is medically stable to discharge to set up home services.   TOC will follow for additional needs.    Expected Discharge Plan: Home w Home Health Services Barriers to Discharge: Continued Medical Work up   Patient Goals and CMS Choice Patient states their goals for this hospitalization and ongoing recovery are:: to go home CMS Medicare.gov Compare Post Acute Care list provided to:: Patient Represenative (must comment) (son)        Expected Discharge Plan and Services       Living arrangements for the past 2 months: Single Family Home                                      Prior Living Arrangements/Services Living arrangements for the past 2 months: Single Family Home Lives with:: Adult Children                   Activities of Daily Living      Permission Sought/Granted                  Emotional Assessment              Admission diagnosis:  Generalized weakness [R53.1] Patient Active Problem List   Diagnosis Date Noted   Generalized weakness 01/31/2023   Alzheimer disease (HCC) 01/31/2023   BPH (benign prostatic hyperplasia) 01/31/2023    COVID-19 virus infection 01/31/2023   Acute metabolic encephalopathy 01/31/2023   Acute urinary retention 01/31/2023   AKI (acute kidney injury) (HCC) 01/31/2023   Bilateral sensorineural hearing loss 05/09/2020   Essential hypertension 07/21/2019   PCP:  Carolynne Edouard, MD Pharmacy:   [facility] [phone] Dan Humphreys, [place] - [address] AT [facility] [address] [facility] [place] [address] Phone: [phone] Fax: [phone]     Social Determinants of Health (SDOH) Social History: SDOH Screenings   Tobacco Use: Low Risk  (07/22/2019)   SDOH Interventions:     Readmission Risk Interventions     No data to display

## 2023-01-31 NOTE — ED Notes (Signed)
Patient has non-restricting mittens on both hands to prevent him from tugging at IV and foley.

## 2023-01-31 NOTE — Assessment & Plan Note (Signed)
Presumed AKI secondary to dehydration Received an IV fluid bolus in the ED Continue to monitor renal function

## 2023-01-31 NOTE — ED Notes (Signed)
This nurse and Lynden Ang, RN in room to respond to pt call light. Upon entering room, pt noted to be standing by wall, naked, with blood smeared in various places in floor. Pt foley cath noted to be disconnected from drainage bag, with catheter still in place to penis. Pt Dislodged IV catheter from arm.  Pt placed back into bed with primary nurse responding to room at this time and updated on prior events.  Fall risk band placed to pt wrist, non slip socks placed on feet, call bell in place, activated and audible with door left open in view of nurses station.

## 2023-01-31 NOTE — Assessment & Plan Note (Signed)
Secondary to acute viral illness Neurologic checks with fall and aspiration precautions

## 2023-01-31 NOTE — ED Notes (Signed)
Patient unable to urinate

## 2023-01-31 NOTE — Evaluation (Signed)
Occupational Therapy Evaluation Patient Details Name: Jeffrey Burke MRN: [id number] DOB: 03-Aug-1935 Today's Date: 01/31/2023   History of Present Illness Jeffrey Burke is a 88 y.o. male with medical history significant for Dementia, hearing loss, hypertension who at baseline ambulates independently and is otherwise active who was brought to the ED with a 1 day history of weakness, decreased appetite and staying in his chair.  His son reportedly had difficulty getting him out of his chair and he appeared shaky, which is unusual for him.  He also seemed more confused than baseline. Patient tested positive for COVID.   Clinical Impression   Pt was seen for OT evaluation this date. Pt limited historian 2/2 cognitive deficits. Per chart pt with baseline history of dementia. Lives at home with 24/7 supervision/assist from family and PCAs. Pt presents to acute OT demonstrating impaired ADL performance and functional mobility 2/2 generalized weakness, decreased cognition, and impaired balance (See OT problem list for additional functional deficits). Pt currently requires MIN A for STS/functional mobility, SET UP assist for seated UB ADL management at EOB, and SUPERVISION for bed mobility.  Pt would benefit from skilled OT services to address noted impairments and functional limitations (see below for any additional details) in order to maximize safety and independence while minimizing falls risk and caregiver burden. Upon hospital discharge, recommend HHOT to maximize pt safety and return to functional independence during meaningful occupations of daily life.       Recommendations for follow up therapy are one component of a multi-disciplinary discharge planning process, led by the attending physician.  Recommendations may be updated based on patient status, additional functional criteria and insurance authorization.   Assistance Recommended at Discharge Frequent or constant Supervision/Assistance   Patient can return home with the following A little help with walking and/or transfers;A little help with bathing/dressing/bathroom;Assistance with cooking/housework;Assist for transportation;Help with stairs or ramp for entrance    Functional Status Assessment  Patient has had a recent decline in their functional status and demonstrates the ability to make significant improvements in function in a reasonable and predictable amount of time.  Equipment Recommendations  BSC/3in1    Recommendations for Other Services       Precautions / Restrictions Precautions Precautions: Fall Restrictions Weight Bearing Restrictions: No      Mobility Bed Mobility Overal bed mobility: Needs Assistance Bed Mobility: Supine to Sit, Sit to Supine     Supine to sit: Supervision Sit to supine: Supervision        Transfers Overall transfer level: Needs assistance Equipment used: 1 person hand held assist Transfers: Sit to/from Stand Sit to Stand: Min guard, Min assist           General transfer comment: x1 instance of lateral LOB with STS      Balance Overall balance assessment: Needs assistance Sitting-balance support: Feet supported Sitting balance-Leahy Scale: Good     Standing balance support: Single extremity supported, During functional activity, Reliant on assistive device for balance Standing balance-Leahy Scale: Fair Standing balance comment: needs assistance                           ADL either performed or assessed with clinical judgement   ADL Overall ADL's : Needs assistance/impaired                                       General  ADL Comments: Pt requires close supervision for bed mobility, min A for STS from gurney bed in ED, and SET UP assist for seated self-feeding from lunch tray this date. Anticipate at least MIN A for LB ADL management with cueing for safety/sequencing t/o funtional tasks 2/2 baseline cognition.     Vision          Perception     Praxis      Pertinent Vitals/Pain Pain Assessment Pain Assessment: No/denies pain     Hand Dominance     Extremity/Trunk Assessment Upper Extremity Assessment Upper Extremity Assessment: Generalized weakness   Lower Extremity Assessment Lower Extremity Assessment: Generalized weakness   Cervical / Trunk Assessment Cervical / Trunk Assessment: Normal   Communication Communication Communication: HOH   Cognition Arousal/Alertness: Awake/alert Behavior During Therapy: WFL for tasks assessed/performed Overall Cognitive Status: History of cognitive impairments - at baseline Area of Impairment: Orientation, Memory, Following commands, Safety/judgement, Awareness                 Orientation Level: Disoriented to, Place, Time, Situation   Memory: Decreased short-term memory Following Commands: Follows one step commands with increased time Safety/Judgement: Decreased awareness of safety Awareness: Intellectual   General Comments: Decreased awareness of lines/leads. Safety mitts donned at start/end of session. No family present to determine baseline cognitive function.     General Comments       Exercises Other Exercises Other Exercises: Pt education limited 2/2 baseline cognition. OT faciltiated ADL management as described above with education on safety, falls prevention, and compensatory strategies t/o. Limited evidence of learning.   Shoulder Instructions      Home Living Family/patient expects to be discharged to:: Private residence                                 Additional Comments: Per chart pt lives at home with 24/7 assist from [facility]. No family available to verify PLOF/home set up.      Prior Functioning/Environment                          OT Problem List: Decreased strength;Decreased coordination;Decreased cognition;Decreased activity tolerance;Decreased safety awareness;Impaired balance (sitting and/or  standing);Decreased knowledge of use of DME or AE      OT Treatment/Interventions: Self-care/ADL training;Therapeutic exercise;Therapeutic activities;DME and/or AE instruction;Patient/family education;Balance training    OT Goals(Current goals can be found in the care plan section) Acute Rehab OT Goals Patient Stated Goal: To eat lunch OT Goal Formulation: With patient Time For Goal Achievement: 02/14/23 Potential to Achieve Goals: Good ADL Goals Pt Will Perform Grooming: sitting;with modified independence Pt Will Perform Lower Body Dressing: sit to/from stand;with supervision;with set-up Pt Will Transfer to Toilet: bedside commode;with supervision;with set-up Pt Will Perform Toileting - Clothing Manipulation and hygiene: sit to/from stand;with supervision;with set-up  OT Frequency: Min 1X/week    Co-evaluation              AM-PAC OT "6 Clicks" Daily Activity     Outcome Measure Help from another person eating meals?: A Little Help from another person taking care of personal grooming?: A Little Help from another person toileting, which includes using toliet, bedpan, or urinal?: A Little Help from another person bathing (including washing, rinsing, drying)?: A Little Help from another person to put on and taking off regular upper body clothing?: A Little Help from another person to put on and taking  off regular lower body clothing?: A Little 6 Click Score: 18   End of Session Equipment Utilized During Treatment: Gait belt;Rolling walker (2 wheels) Nurse Communication: Mobility status  Activity Tolerance: Patient tolerated treatment well Patient left: in bed;with bed alarm set;with call bell/phone within reach  OT Visit Diagnosis: Other abnormalities of gait and mobility (R26.89);Other symptoms and signs involving cognitive function                Time: 1300-1328 OT Time Calculation (min): 28 min Charges:  OT General Charges $OT Visit: 1 Visit OT Evaluation $OT Eval  Moderate Complexity: 1 Mod OT Treatments $Self Care/Home Management : 8-22 mins  Rockney Ghee, M.S., OTR/L 01/31/23, 3:21 PM

## 2023-01-31 NOTE — Assessment & Plan Note (Addendum)
Patient without respiratory symptoms, symptomatic for weakness, fever and confusion slightly beyond baseline COVID precautions Supportive care

## 2023-01-31 NOTE — Progress Notes (Signed)
Interim progress note not for billing.  I've reviewed chart, seen and examined patient. Agree with h and p unless otherwise stipulated.  Here with covid encephalopathy, also acute urinary retention. Hadn't urinated at home nor in waiting room and bladder scan in ER showed 450. Has borderline aki, labs otherwise unremarkable. Ct head cxr nothing acute. Son says patient's weakness and confusion precludes coming home. Will order pt/ot evals, appears patient will likely need SNF. Will also start gentle fluids and d/c lisinopril given admitting provider's concern for AKI. Will continue patient's doxazosin and finasteride, would plan on TOV tomorrow.

## 2023-01-31 NOTE — Assessment & Plan Note (Signed)
Continue memantine and duloxetine Delirium precautions -Monitor and correct any metabolic abnormalities, infections and electrolyte imbalances which can contribute to altered mental status. -Minimize use of narcotics, benzodiazepines, anti cholingeneric, and antihistamine medications as these drug classes can aggravate delirium -Patient would benefit from staying awake during the daylight hours. Lights off at nighttime to facilitate sleep better

## 2023-01-31 NOTE — H&P (Signed)
History and Physical    Patient: Jeffrey Burke YNW:[id number] DOB: 08-29-1935 DOA: 01/30/2023 DOS: the patient was seen and examined on 01/31/2023 PCP: Carolynne Edouard, MD  Patient coming from: Home  Chief Complaint:  Chief Complaint  Patient presents with   Weakness    HPI: Jeffrey Burke is a 88 y.o. male with medical history significant for Dementia, hearing loss, hypertension who at baseline ambulates independently and is otherwise active who was brought to the ED with a 1 day history of weakness, decreased appetite and staying in his chair.  His son reportedly had difficulty getting him out of his chair and he appeared shaky, which is unusual for him.  He also seemed more confused than baseline.  He has had no fever or chills, cough or shortness of breath, vomiting, diarrhea or complaints of pain. ED course and data review: Temp 100.7 with pulse 74 respirations 26.  BP 134/59.  Labs significant for normal CBC/WBC but with lactic acid of 2.2.  BUN/creatinine 26/1.26.  Most recent creatinine on record is 1.0 from December 2022.COVID-positive. EKG, personally viewed and interpreted showing NSR at 80 with nonspecific ST-T wave changes.  Chest x-ray with no active disease and head CT nonacute, showing generalized cerebral atrophy and microvascular disease changes. Bladder scan was done and showed 450 mL and a Foley catheter was placed in the ED. Patient was bolused with a liter of LR. Hospitalist consulted for admission.   Review of Systems: As mentioned in the history of present illness. All other systems reviewed and are negative.  Past Medical History:  Diagnosis Date   Alzheimer disease (HCC)    BPH (benign prostatic hyperplasia)    Hypercholesteremia    Neuropathy    No past surgical history on file. Social History:  reports that he has never smoked. He has never used smokeless tobacco. He reports that he does not drink alcohol and does not use drugs.  No Known  Allergies  Family History  Problem Relation Age of Onset   Stroke Mother    Heart failure Father     Prior to Admission medications   Medication Sig Start Date End Date Taking? Authorizing Provider  doxazosin (CARDURA) 4 MG tablet Take 4 mg by mouth at bedtime. 06/11/19  Yes [provider]  DULoxetine (CYMBALTA) 20 MG capsule Take 20 mg by mouth daily. 12/13/22  Yes [provider]  finasteride (PROSCAR) 5 MG tablet Take 5 mg by mouth daily. 06/11/19  Yes [provider]  gabapentin (NEURONTIN) 100 MG capsule Take 100 mg by mouth at bedtime. 06/11/19  Yes [provider]  lisinopril (ZESTRIL) 5 MG tablet Take 5 mg by mouth daily.   Yes [provider]  lovastatin (MEVACOR) 40 MG tablet Take 40 mg by mouth at bedtime. 06/11/19  Yes [provider]  memantine (NAMENDA) 10 MG tablet Take 10 mg by mouth daily. 06/11/19  Yes [provider]  omeprazole (PRILOSEC) 40 MG capsule Take 40 mg by mouth daily. 12/13/22  Yes [provider]  amoxicillin (AMOXIL) 875 MG tablet Take 1 tablet (875 mg total) by mouth 2 (two) times daily. Patient not taking: Reported on 01/30/2023 07/15/19   Payton Mccallum, MD    Physical Exam: Vitals:   01/30/23 2252 01/30/23 2347 01/31/23 0100 01/31/23 0230  BP: 136/62  (!) 134/59 133/62  Pulse: 81 74 84 98  Resp: 20 (!) 26 (!) 23 (!) 30  Temp: 99.7 F (37.6 C) (!) 100.7 F (38.2 C)  99.1 F (37.3 C)  TempSrc: Oral Rectal  Oral  SpO2: 98% 96% 95% 93%  Weight:      Height:       Physical Exam Vitals and nursing note reviewed.  Constitutional:      General: He is not in acute distress. HENT:     Head: Normocephalic and atraumatic.  Cardiovascular:     Rate and Rhythm: Normal rate and regular rhythm.     Heart sounds: Normal heart sounds.  Pulmonary:     Effort: Pulmonary effort is normal.     Breath sounds: Normal breath sounds.  Abdominal:     Palpations: Abdomen is soft.     Tenderness:  There is no abdominal tenderness.  Neurological:     Mental Status: He is disoriented.     Labs on Admission: I have personally reviewed following labs and imaging studies  CBC: Recent Labs  Lab 01/30/23 1703  WBC 6.0  NEUTROABS 4.3  HGB 13.2  HCT 40.3  MCV 90.8  PLT 175   Basic Metabolic Panel: Recent Labs  Lab 01/30/23 1703  NA 134*  K 4.0  CL 105  CO2 23  GLUCOSE 95  BUN 25*  CREATININE 1.26*  CALCIUM 7.9*   GFR: Estimated Creatinine Clearance: 40.1 mL/min (A) (by C-G formula based on SCr of 1.26 mg/dL (H)). Liver Function Tests: Recent Labs  Lab 01/30/23 1703  AST 22  ALT 17  ALKPHOS 60  BILITOT 0.7  PROT 6.4*  ALBUMIN 3.9   No results for input(s): "LIPASE", "AMYLASE" in the last 168 hours. No results for input(s): "AMMONIA" in the last 168 hours. Coagulation Profile: Recent Labs  Lab 01/30/23 1703  INR 1.1   Cardiac Enzymes: No results for input(s): "CKTOTAL", "CKMB", "CKMBINDEX", "TROPONINI" in the last 168 hours. BNP (last 3 results) No results for input(s): "PROBNP" in the last 8760 hours. HbA1C: No results for input(s): "HGBA1C" in the last 72 hours. CBG: Recent Labs  Lab 01/30/23 1657  GLUCAP 93   Lipid Profile: No results for input(s): "CHOL", "HDL", "LDLCALC", "TRIG", "CHOLHDL", "LDLDIRECT" in the last 72 hours. Thyroid Function Tests: No results for input(s): "TSH", "T4TOTAL", "FREET4", "T3FREE", "THYROIDAB" in the last 72 hours. Anemia Panel: No results for input(s): "VITAMINB12", "FOLATE", "FERRITIN", "TIBC", "IRON", "RETICCTPCT" in the last 72 hours. Urine analysis:    Component Value Date/Time   COLORURINE YELLOW (A) 01/31/2023 0005   APPEARANCEUR CLEAR (A) 01/31/2023 0005   LABSPEC 1.024 01/31/2023 0005   PHURINE 5.0 01/31/2023 0005   GLUCOSEU NEGATIVE 01/31/2023 0005   HGBUR LARGE (A) 01/31/2023 0005   BILIRUBINUR NEGATIVE 01/31/2023 0005   KETONESUR NEGATIVE 01/31/2023 0005   PROTEINUR NEGATIVE 01/31/2023 0005    NITRITE NEGATIVE 01/31/2023 0005   LEUKOCYTESUR NEGATIVE 01/31/2023 0005    Radiological Exams on Admission: DG Chest Portable 1 View  Result Date: 01/30/2023 CLINICAL DATA:  Shortness of breath EXAM: PORTABLE CHEST 1 VIEW COMPARISON:  None Available. FINDINGS: The heart size and mediastinal contours are within normal limits. Aortic atherosclerosis. Both lungs are clear. The visualized skeletal structures are unremarkable. IMPRESSION: No active disease. Electronically Signed   By: Jasmine Pang M.D.   On: 01/30/2023 23:43   CT HEAD WO CONTRAST  Result Date: 01/30/2023 CLINICAL DATA:  Altered speech and difficulty walking. EXAM: CT HEAD WITHOUT CONTRAST TECHNIQUE: Contiguous axial images were obtained from the base of the skull through the vertex without intravenous contrast. RADIATION DOSE REDUCTION: This exam was performed according to the departmental dose-optimization  program which includes automated exposure control, adjustment of the mA and/or kV according to patient size and/or use of iterative reconstruction technique. COMPARISON:  July 15, 2019 FINDINGS: Brain: There is mild to moderate severity cerebral atrophy with widening of the extra-axial spaces and ventricular dilatation. There are areas of decreased attenuation within the white matter tracts of the supratentorial brain, consistent with microvascular disease changes. Vascular: No hyperdense vessel or unexpected calcification. Skull: Normal. Negative for fracture or focal lesion. Sinuses/Orbits: No acute finding. Other: None. IMPRESSION: 1. No acute intracranial abnormality. 2. Generalized cerebral atrophy and microvascular disease changes of the supratentorial brain. Electronically Signed   By: Aram Candela M.D.   On: 01/30/2023 17:31     Data Reviewed: Relevant notes from primary care and specialist visits, past discharge summaries as available in EHR, including Care Everywhere. Prior diagnostic testing as pertinent to current  admission diagnoses Updated medications and problem lists for reconciliation ED course, including vitals, labs, imaging, treatment and response to treatment Triage notes, nursing and pharmacy notes and ED provider's notes Notable results as noted in HPI   Assessment and Plan: * Generalized weakness Secondary to acute illness At baseline patient speed walks and runs Can consider PT eval  COVID-19 virus infection Patient without respiratory symptoms, symptomatic for weakness, fever and confusion slightly beyond baseline COVID precautions Supportive care  Acute metabolic encephalopathy Secondary to acute viral illness Neurologic checks with fall and aspiration precautions  Acute urinary retention BPH Patient had a Foley placed in the ED for bladder scan of 450, not postvoid Continue home Cardura and Proscar Voiding trial within the next day or 2  AKI (acute kidney injury) (HCC) Presumed AKI secondary to dehydration Received an IV fluid bolus in the ED Continue to monitor renal function  Bilateral sensorineural hearing loss Supportive care  Essential hypertension Continue lisinopril  Alzheimer disease (HCC) Continue memantine and duloxetine Delirium precautions -Monitor and correct any metabolic abnormalities, infections and electrolyte imbalances which can contribute to altered mental status. -Minimize use of narcotics, benzodiazepines, anti cholingeneric, and antihistamine medications as these drug classes can aggravate delirium -Patient would benefit from staying awake during the daylight hours. Lights off at nighttime to facilitate sleep better     DVT prophylaxis: Lovenox  Consults: none  Advance Care Planning:   Code Status: Full Code   Family Communication: none  Disposition Plan: Back to previous home environment  Severity of Illness: The appropriate patient status for this patient is INPATIENT. Inpatient status is judged to be reasonable and necessary  in order to provide the required intensity of service to ensure the patient's safety. The patient's presenting symptoms, physical exam findings, and initial radiographic and laboratory data in the context of their chronic comorbidities is felt to place them at high risk for further clinical deterioration. Furthermore, it is not anticipated that the patient will be medically stable for discharge from the hospital within 2 midnights of admission.   * I certify that at the point of admission it is my clinical judgment that the patient will require inpatient hospital care spanning beyond 2 midnights from the point of admission due to high intensity of service, high risk for further deterioration and high frequency of surveillance required.*  Author: Andris Baumann, MD 01/31/2023 4:16 AM  For on call review [email or web address].

## 2023-01-31 NOTE — Evaluation (Signed)
Physical Therapy Evaluation Patient Details Name: Jylen Richner MRN: [id number] DOB: Jan 09, 1935 Today's Date: 01/31/2023  History of Present Illness  Dreveon Malvin is a 88 y.o. male with medical history significant for Dementia, hearing loss, hypertension who at baseline ambulates independently and is otherwise active who was brought to the ED with a 1 day history of weakness, decreased appetite and staying in his chair.  His son reportedly had difficulty getting him out of his chair and he appeared shaky, which is unusual for him.  He also seemed more confused than baseline. Patient tested positive for COVID.   Clinical Impression  Patient received in bed, mitts on B UEs. Patient is HOH and confused. Able to tell me his name, but no other history. Not oriented to place, situation. Patient is agreeable to mobility. He performed bed mobility with min guard. Transfers with min guard. Ambulated with min A 10' in room. Patient will benefit from continued skilled PT to improve strength and safety with mobility.        Recommendations for follow up therapy are one component of a multi-disciplinary discharge planning process, led by the attending physician.  Recommendations may be updated based on patient status, additional functional criteria and insurance authorization.  Follow Up Recommendations       Assistance Recommended at Discharge Frequent or constant Supervision/Assistance  Patient can return home with the following  A little help with walking and/or transfers;A little help with bathing/dressing/bathroom;Help with stairs or ramp for entrance;Assist for transportation;Assistance with cooking/housework;Direct supervision/assist for medications management    Equipment Recommendations None recommended by PT (to be determined)  Recommendations for Other Services       Functional Status Assessment Patient has had a recent decline in their functional status and demonstrates the ability to  make significant improvements in function in a reasonable and predictable amount of time.     Precautions / Restrictions Precautions Precautions: Fall Restrictions Weight Bearing Restrictions: No      Mobility  Bed Mobility Overal bed mobility: Needs Assistance Bed Mobility: Supine to Sit, Sit to Supine     Supine to sit: Min guard Sit to supine: Min guard        Transfers Overall transfer level: Needs assistance Equipment used: 1 person hand held assist Transfers: Sit to/from Stand Sit to Stand: Min guard                Ambulation/Gait Ambulation/Gait assistance: Editor, commissioning (Feet): 10 Feet Assistive device: 1 person hand held assist Gait Pattern/deviations: Step-through pattern, Decreased step length - right, Decreased step length - left, Decreased stride length, Trunk flexed Gait velocity: decr     General Gait Details: patient needs min A with mobility. No overt LOB.  Stairs            Wheelchair Mobility    Modified Rankin (Stroke Patients Only)       Balance Overall balance assessment: Needs assistance Sitting-balance support: Feet supported Sitting balance-Leahy Scale: Good     Standing balance support: Single extremity supported, During functional activity, Reliant on assistive device for balance Standing balance-Leahy Scale: Fair Standing balance comment: needs assistance                             Pertinent Vitals/Pain Pain Assessment Pain Assessment: PAINAD Breathing: normal Negative Vocalization: none Facial Expression: smiling or inexpressive Body Language: relaxed Consolability: no need to console PAINAD Score: 0    Home Living  Additional Comments: unsure. Patient not able to provide any history and no family present.    Prior Function                       Hand Dominance        Extremity/Trunk Assessment   Upper Extremity Assessment Upper  Extremity Assessment: Defer to OT evaluation    Lower Extremity Assessment Lower Extremity Assessment: Generalized weakness    Cervical / Trunk Assessment Cervical / Trunk Assessment: Normal  Communication   Communication: HOH  Cognition Arousal/Alertness: Awake/alert Behavior During Therapy: WFL for tasks assessed/performed Overall Cognitive Status: No family/caregiver present to determine baseline cognitive functioning Area of Impairment: Orientation                 Orientation Level: Disoriented to, Place, Time, Situation                      General Comments      Exercises     Assessment/Plan    PT Assessment Patient needs continued PT services  PT Problem List Decreased strength;Decreased activity tolerance;Decreased balance;Decreased mobility;Decreased safety awareness;Decreased cognition;Decreased coordination       PT Treatment Interventions Gait training;DME instruction;Therapeutic exercise;Functional mobility training;Therapeutic activities;Patient/family education;Balance training    PT Goals (Current goals can be found in the Care Plan section)  Acute Rehab PT Goals Patient Stated Goal: none stated PT Goal Formulation: Patient unable to participate in goal setting Time For Goal Achievement: 02/08/23    Frequency Min 2X/week     Co-evaluation               AM-PAC PT "6 Clicks" Mobility  Outcome Measure Help needed turning from your back to your side while in a flat bed without using bedrails?: A Little Help needed moving from lying on your back to sitting on the side of a flat bed without using bedrails?: A Little Help needed moving to and from a bed to a chair (including a wheelchair)?: A Little Help needed standing up from a chair using your arms (e.g., wheelchair or bedside chair)?: A Little Help needed to walk in hospital room?: A Little Help needed climbing 3-5 steps with a railing? : A Lot 6 Click Score: 17    End of  Session   Activity Tolerance: Patient tolerated treatment well Patient left: in bed;with call bell/phone within reach;with bed alarm set Nurse Communication: Mobility status PT Visit Diagnosis: Unsteadiness on feet (R26.81);Other abnormalities of gait and mobility (R26.89);Muscle weakness (generalized) (M62.81);Difficulty in walking, not elsewhere classified (R26.2)    Time: 1150-1200 PT Time Calculation (min) (ACUTE ONLY): 10 min   Charges:   PT Evaluation $PT Eval Low Complexity: 1 Low          Samyiah Halvorsen, PT, GCS 01/31/23,1:04 PM

## 2023-01-31 NOTE — ED Notes (Signed)
Pt alert, NAD, calm, resting/ sleeping, passively interactive, returned to monitor, VSS, foley draining red.

## 2023-01-31 NOTE — Assessment & Plan Note (Signed)
Supportive care. 

## 2023-01-31 NOTE — ED Notes (Signed)
Son updated via phone.  

## 2023-01-31 NOTE — ED Notes (Signed)
On hourly rounding, this RN found patient sitting on edge of bed disrobed with brief off, and was tugging on coban wrapped around his IV. This RN changed the patient's gown, applied new clean brief, reapplied cardiac leads and reconnected patient to cardiac monitor. Patient was redirectable in getting him back in bed, but confused. Patient is only oriented to self at times. (Did not respond to his name every time).

## 2023-01-31 NOTE — ED Notes (Signed)
Patient had pulled both PIV's and disconnected foley drainage bag from indwelling catheter. Blood noted at meatus. Bed bath performed. New PIV placed. Peri care performed. 10 ml saline removed from foley balloon and foley re-advanced and balloon re-inflated with blood tinged urine now draining from foley. Bilateral mittens placed on patient. Bed alarm on. Call light in reach. Bed in lowest position with siderails up. NSR on monitor at 80's. Resp even, unlabored on RA. No distress noted. Patient denies further needs.

## 2023-01-31 NOTE — Assessment & Plan Note (Signed)
Continue lisinopril

## 2023-01-31 NOTE — Assessment & Plan Note (Addendum)
BPH Patient had a Foley placed in the ED for bladder scan of 450, not postvoid Continue home Cardura and Proscar Voiding trial within the next day or 2

## 2023-01-31 NOTE — Assessment & Plan Note (Signed)
Secondary to acute illness At baseline patient speed walks and runs Can consider PT eval

## 2023-02-01 DIAGNOSIS — R531 Weakness: Secondary | ICD-10-CM | POA: Diagnosis not present

## 2023-02-01 DIAGNOSIS — R310 Gross hematuria: Secondary | ICD-10-CM | POA: Insufficient documentation

## 2023-02-01 LAB — URINE CULTURE: Culture: NO GROWTH

## 2023-02-01 LAB — CULTURE, BLOOD (ROUTINE X 2): Special Requests: ADEQUATE

## 2023-02-01 LAB — BASIC METABOLIC PANEL
Anion gap: 5 (ref 5–15)
BUN: 18 mg/dL (ref 8–23)
CO2: 24 mmol/L (ref 22–32)
Calcium: 7.4 mg/dL — ABNORMAL LOW (ref 8.9–10.3)
Chloride: 107 mmol/L (ref 98–111)
Creatinine, Ser: 1.09 mg/dL (ref 0.61–1.24)
GFR, Estimated: >60 mL/min (ref >60)
Glucose, Bld: 85 mg/dL (ref 70–99)
Potassium: 3.6 mmol/L (ref 3.5–5.1)
Sodium: 136 mmol/L (ref 135–145)

## 2023-02-01 LAB — VITAMIN B12: Vitamin B-12: 486 pg/mL (ref 180–914)

## 2023-02-01 MED ORDER — CHLORHEXIDINE GLUCONATE CLOTH 2 % EX PADS
6.0000 | MEDICATED_PAD | Freq: Every day | CUTANEOUS | Status: DC
  Administered 2023-02-01 – 2023-02-02 (×2): 6 via TOPICAL

## 2023-02-01 MED ORDER — POLYETHYLENE GLYCOL 3350 17 G PO PACK
17.0000 g | PACK | Freq: Every day | ORAL | Status: DC
  Administered 2023-02-01 – 2023-02-02 (×2): 17 g via ORAL
  Filled 2023-02-01 (×2): qty 1

## 2023-02-01 MED ORDER — ENSURE ENLIVE PO LIQD
237.0000 mL | Freq: Two times a day (BID) | ORAL | Status: DC
  Administered 2023-02-02: 237 mL via ORAL

## 2023-02-01 MED ORDER — ADULT MULTIVITAMIN W/MINERALS CH
1.0000 | ORAL_TABLET | Freq: Every day | ORAL | Status: DC
  Administered 2023-02-01 – 2023-02-02 (×2): 1 via ORAL
  Filled 2023-02-01 (×2): qty 1

## 2023-02-01 NOTE — Plan of Care (Signed)

## 2023-02-01 NOTE — Progress Notes (Signed)
Initial Nutrition Assessment  DOCUMENTATION CODES:   Not applicable  INTERVENTION:   -Liberalize diet to regular for widest variety of meal selections -MVI with minerals daily -Ensure Enlive po BID, each supplement provides 350 kcal and 20 grams of protein -Magic cup TID with meals, each supplement provides 290 kcal and 9 grams of protein  - Feeding assistance with meals   NUTRITION DIAGNOSIS:   Moderate Malnutrition related to chronic illness (dementia) as evidenced by mild fat depletion, mild muscle depletion.  GOAL:   Patient will meet greater than or equal to 90% of their needs  MONITOR:   PO intake, Supplement acceptance  REASON FOR ASSESSMENT:   Malnutrition Screening Tool    ASSESSMENT:   Pt with medical history significant for Dementia, hearing loss, hypertension who at baseline ambulates independently and is otherwise active who was brought in history of weakness, decreased appetite and staying in his chair.  Pt admitted with generalized weakness secondary to COVID-19 infection and acute metabolic encephalopathy.   Reviewed I/O's: -350 ml x 24 hours and +650 ml since admission  UOP: 350 ml x 24 hours  Pt lying in bed at time of visit. He did not respond to voice or touch. No family present to provide additional history.   Pt currently on a heart healthy diet. No meal completion data available to assess at this time.  Noted lunch tray was untouched. Pt has access to his dentures; noted them on tray table.   Reviewed wt hx; no wt loss noted.   Pt with increased nutritional needs for acute illness and would benefit from addition of oral nutrition supplements.   Medications reviewed and include miralax.   Labs reviewed: CBGS: 93.   NUTRITION - FOCUSED PHYSICAL EXAM:  Flowsheet Row Most Recent Value  Orbital Region No depletion  Upper Arm Region Mild depletion  Thoracic and Lumbar Region No depletion  Buccal Region Mild depletion  Temple Region Mild  depletion  Clavicle Bone Region Mild depletion  Clavicle and Acromion Bone Region No depletion  Scapular Bone Region No depletion  Dorsal Hand Mild depletion  Patellar Region No depletion  Anterior Thigh Region No depletion  Posterior Calf Region No depletion  Edema (RD Assessment) None  Hair Reviewed  Eyes Reviewed  Mouth Reviewed  Skin Reviewed  Nails Reviewed       Diet Order:   Diet Order             Diet Heart Room service appropriate? Yes; Fluid consistency: Thin  Diet effective now                   EDUCATION NEEDS:   No education needs have been identified at this time  Skin:  Skin Assessment: Reviewed RN Assessment  Last BM:  Unknown  Height:   Ht Readings from Last 1 Encounters:  01/30/23 5\' 7"  (1.702 m)    Weight:   Wt Readings from Last 1 Encounters:  01/30/23 72.6 kg    Ideal Body Weight:  67.3 kg  BMI:  Body mass index is 25.06 kg/m.  Estimated Nutritional Needs:   Kcal:  1800-2000  Protein:  90-105 grams  Fluid:  > 1.8 L    Levada Schilling, RD, LDN, CDCES Registered Dietitian II Certified Diabetes Care and Education Specialist Please refer to [facility] for RD and/or RD on-call/weekend/after hours pager

## 2023-02-01 NOTE — Progress Notes (Addendum)
PROGRESS NOTE    Jeffrey Burke  XBJ:[id number] DOB: 02/02/1935 DOA: 01/30/2023 PCP: Carolynne Edouard, MD     Brief Narrative:   From admission h and p  Jeffrey Burke is a 88 y.o. male with medical history significant for Dementia, hearing loss, hypertension who at baseline ambulates independently and is otherwise active who was brought to the ED with a 1 day history of weakness, decreased appetite and staying in his chair.  His son reportedly had difficulty getting him out of his chair and he appeared shaky, which is unusual for him.  He also seemed more confused than baseline.  He has had no fever or chills, cough or shortness of breath, vomiting, diarrhea or complaints of pain. ED course and data review: Temp 100.7 with pulse 74 respirations 26.  BP 134/59.  Labs significant for normal CBC/WBC but with lactic acid of 2.2.  BUN/creatinine 26/1.26.  Most recent creatinine on record is 1.0 from December 2022.COVID-positive. EKG, personally viewed and interpreted showing NSR at 80 with nonspecific ST-T wave changes.  Chest x-ray with no active disease and head CT nonacute, showing generalized cerebral atrophy and microvascular disease changes. Bladder scan was done and showed 450 mL and a Foley catheter was placed in the ED. Patient was bolused with a liter of LR. Hospitalist consulted for admission.   Assessment & Plan:   Principal Problem:   Generalized weakness Active Problems:   COVID-19 virus infection   Acute metabolic encephalopathy   Acute urinary retention   Alzheimer disease (HCC)   BPH (benign prostatic hyperplasia)   Essential hypertension   Bilateral sensorineural hearing loss   AKI (acute kidney injury) (HCC)   Generalized weakness Secondary to acute illness PT/OT advising home health   COVID-19 virus infection Patient without respiratory symptoms, symptomatic for weakness, fever and confusion slightly beyond baseline COVID precautions Supportive care    Acute metabolic encephalopathy Secondary to acute viral illness Neurologic checks with fall and aspiration precautions   Acute urinary retention BPH Gross hematuria Patient had a Foley placed in the ED for bladder scan of 450, not postvoid. Now with gross hematuria, discussed with nursing and Dr. Lonna Cobb of urology, patient has been pulling at the foley so this is likely traumatic. The foley is draining. Dr. Lonna Cobb advises irrigation and removal, will I/o cath as needed    AKI (acute kidney injury) (HCC) Presumed AKI secondary to dehydration Resolved with hydration   Bilateral sensorineural hearing loss Supportive care   Essential hypertension Holding lisinopril   Alzheimer disease (HCC) Continue memantine and duloxetine Delirium precautions   DVT prophylaxis: SCDs Code Status: full Family Communication: son updated telephonically  Level of care: Med-Surg Status is: Inpatient Remains inpatient appropriate because: evaluating gross hematuria    Consultants:  none  Procedures: none  Antimicrobials:  none    Subjective: No complaints  Objective: Vitals:   01/31/23 2030 01/31/23 2051 01/31/23 2140 02/01/23 0916  BP: (!) 143/61 (!) 132/52 (!) 134/56 (!) 113/47  Pulse: 85 84 74 81  Resp: (!) 28 (!) 27 18 16   Temp:  99.8 F (37.7 C) 99.4 F (37.4 C) 98.7 F (37.1 C)  TempSrc:  Oral Oral Axillary  SpO2: 97% 94% 94% 92%  Weight:      Height:        Intake/Output Summary (Last 24 hours) at 02/01/2023 1308 Last data filed at 02/01/2023 1027 Gross per 24 hour  Intake 865 ml  Output 350 ml  Net 515 ml   Filed  Weights   01/30/23 1700  Weight: 72.6 kg    Examination:  General exam: Appears calm and comfortable  Respiratory system: Clear to auscultation. Respiratory effort normal. Cardiovascular system: S1 & S2 heard, RRR. Soft systolic murmur Gastrointestinal system: Abdomen is nondistended, soft and nontender. No organomegaly or masses felt. Normal  bowel sounds heard. Central nervous system: awake, moving all 4 Extremities: warm GU: blood at meatus Skin: No rashes, lesions or ulcers Psychiatry: confused    Data Reviewed: I have personally reviewed following labs and imaging studies  CBC: Recent Labs  Lab 01/30/23 1703  WBC 6.0  NEUTROABS 4.3  HGB 13.2  HCT 40.3  MCV 90.8  PLT 175   Basic Metabolic Panel: Recent Labs  Lab 01/30/23 1703 02/01/23 0612  NA 134* 136  K 4.0 3.6  CL 105 107  CO2 23 24  GLUCOSE 95 85  BUN 25* 18  CREATININE 1.26* 1.09  CALCIUM 7.9* 7.4*   GFR: Estimated Creatinine Clearance: 46.3 mL/min (by C-G formula based on SCr of 1.09 mg/dL). Liver Function Tests: Recent Labs  Lab 01/30/23 1703  AST 22  ALT 17  ALKPHOS 60  BILITOT 0.7  PROT 6.4*  ALBUMIN 3.9   No results for input(s): "LIPASE", "AMYLASE" in the last 168 hours. No results for input(s): "AMMONIA" in the last 168 hours. Coagulation Profile: Recent Labs  Lab 01/30/23 1703  INR 1.1   Cardiac Enzymes: No results for input(s): "CKTOTAL", "CKMB", "CKMBINDEX", "TROPONINI" in the last 168 hours. BNP (last 3 results) No results for input(s): "PROBNP" in the last 8760 hours. HbA1C: No results for input(s): "HGBA1C" in the last 72 hours. CBG: Recent Labs  Lab 01/30/23 1657  GLUCAP 93   Lipid Profile: No results for input(s): "CHOL", "HDL", "LDLCALC", "TRIG", "CHOLHDL", "LDLDIRECT" in the last 72 hours. Thyroid Function Tests: Recent Labs    01/31/23 0035  TSH 2.634   Anemia Panel: Recent Labs    02/01/23 0611  VITAMINB12 486   Urine analysis:    Component Value Date/Time   COLORURINE YELLOW (A) 01/31/2023 0005   APPEARANCEUR CLEAR (A) 01/31/2023 0005   LABSPEC 1.024 01/31/2023 0005   PHURINE 5.0 01/31/2023 0005   GLUCOSEU NEGATIVE 01/31/2023 0005   HGBUR LARGE (A) 01/31/2023 0005   BILIRUBINUR NEGATIVE 01/31/2023 0005   KETONESUR NEGATIVE 01/31/2023 0005   PROTEINUR NEGATIVE 01/31/2023 0005    NITRITE NEGATIVE 01/31/2023 0005   LEUKOCYTESUR NEGATIVE 01/31/2023 0005   Sepsis Labs: @LABRCNTIP (procalcitonin:4,lacticidven:4)  ) Recent Results (from the past 240 hour(s))  Urine Culture     Status: None   Collection Time: 01/31/23 12:05 AM   Specimen: Urine, Clean Catch  Result Value Ref Range Status   Specimen Description   Final    URINE, CLEAN CATCH Performed at [facility], [address], [place], [place] [address]    Special Requests   Final    NONE Performed at [facility], [address] [place], [place] [address]    Culture   Final    NO GROWTH Performed at [facility], [address] [place], [place] [address]    Report Status 02/01/2023 FINAL  Final  Blood culture (routine x 2)     Status: None (Preliminary result)   Collection Time: 01/31/23 12:34 AM   Specimen: BLOOD  Result Value Ref Range Status   Specimen Description BLOOD  LAC  Final   Special Requests   Final    BOTTLES DRAWN AEROBIC AND ANAEROBIC Blood  Culture adequate volume   Culture   Final    NO GROWTH 1 DAY Performed at [facility], [address] [place], [place] [address]    Report Status PENDING  Incomplete  Resp panel by RT-PCR (RSV, Flu A&B, Covid) Anterior Nasal Swab     Status: Abnormal   Collection Time: 01/31/23 12:35 AM   Specimen: Anterior Nasal Swab  Result Value Ref Range Status   SARS Coronavirus 2 by RT PCR POSITIVE (A) NEGATIVE Final    Comment: (NOTE) SARS-CoV-2 target nucleic acids are DETECTED.  The SARS-CoV-2 RNA is generally detectable in upper respiratory specimens during the acute phase of infection. Positive results are indicative of the presence of the identified virus, but do not rule out bacterial infection or co-infection with other pathogens not detected by the test. Clinical correlation with patient history and other diagnostic information is necessary to determine patient infection status. The expected result is  Negative.  Fact Sheet for Patients: BloggerCourse.com  Fact Sheet for Healthcare Providers: SeriousBroker.it  This test is not yet approved or cleared by the [place] FDA and  has been authorized for detection and/or diagnosis of SARS-CoV-2 by FDA under an Emergency Use Authorization (EUA).  This EUA will remain in effect (meaning this test can be used) for the duration of  the COVID-19 declaration under Section 564(b)(1) of the A ct, 21 U.S.C. section 360bbb-3(b)(1), unless the authorization is terminated or revoked sooner.     Influenza A by PCR NEGATIVE NEGATIVE Final   Influenza B by PCR NEGATIVE NEGATIVE Final    Comment: (NOTE) The Xpert Xpress SARS-CoV-2/FLU/RSV plus assay is intended as an aid in the diagnosis of influenza from Nasopharyngeal swab specimens and should not be used as a sole basis for treatment. Nasal washings and aspirates are unacceptable for Xpert Xpress SARS-CoV-2/FLU/RSV testing.  Fact Sheet for Patients: BloggerCourse.com  Fact Sheet for Healthcare Providers: SeriousBroker.it  This test is not yet approved or cleared by the [place] FDA and has been authorized for detection and/or diagnosis of SARS-CoV-2 by FDA under an Emergency Use Authorization (EUA). This EUA will remain in effect (meaning this test can be used) for the duration of the COVID-19 declaration under Section 564(b)(1) of the Act, 21 U.S.C. section 360bbb-3(b)(1), unless the authorization is terminated or revoked.     Resp Syncytial Virus by PCR NEGATIVE NEGATIVE Final    Comment: (NOTE) Fact Sheet for Patients: BloggerCourse.com  Fact Sheet for Healthcare Providers: SeriousBroker.it  This test is not yet approved or cleared by the [place] FDA and has been authorized for detection and/or diagnosis of SARS-CoV-2  by FDA under an Emergency Use Authorization (EUA). This EUA will remain in effect (meaning this test can be used) for the duration of the COVID-19 declaration under Section 564(b)(1) of the Act, 21 U.S.C. section 360bbb-3(b)(1), unless the authorization is terminated or revoked.  Performed at [facility], [address] [place], [place] [address]   Blood culture (routine x 2)     Status: None (Preliminary result)   Collection Time: 01/31/23 12:35 AM   Specimen: BLOOD  Result Value Ref Range Status   Specimen Description BLOOD  LFA  Final   Special Requests   Final    BOTTLES DRAWN AEROBIC AND ANAEROBIC Blood Culture adequate volume   Culture   Final    NO GROWTH 1 DAY Performed at [facility], [address], [place], [place] [address]    Report Status PENDING  Incomplete         Radiology Studies: DG Chest Portable 1 View  Result Date: 01/30/2023 CLINICAL DATA:  Shortness of breath EXAM: PORTABLE CHEST 1 VIEW COMPARISON:  None Available. FINDINGS: The heart size and mediastinal contours are within normal limits. Aortic atherosclerosis. Both lungs are clear. The visualized skeletal structures are unremarkable. IMPRESSION: No active disease. Electronically Signed   By: Jasmine Pang M.D.   On: 01/30/2023 23:43   CT HEAD WO CONTRAST  Result Date: 01/30/2023 CLINICAL DATA:  Altered speech and difficulty walking. EXAM: CT HEAD WITHOUT CONTRAST TECHNIQUE: Contiguous axial images were obtained from the base of the skull through the vertex without intravenous contrast. RADIATION DOSE REDUCTION: This exam was performed according to the departmental dose-optimization program which includes automated exposure control, adjustment of the mA and/or kV according to patient size and/or use of iterative reconstruction technique. COMPARISON:  July 15, 2019 FINDINGS: Brain: There is mild to moderate severity cerebral atrophy with widening of the extra-axial spaces and  ventricular dilatation. There are areas of decreased attenuation within the white matter tracts of the supratentorial brain, consistent with microvascular disease changes. Vascular: No hyperdense vessel or unexpected calcification. Skull: Normal. Negative for fracture or focal lesion. Sinuses/Orbits: No acute finding. Other: None. IMPRESSION: 1. No acute intracranial abnormality. 2. Generalized cerebral atrophy and microvascular disease changes of the supratentorial brain. Electronically Signed   By: Aram Candela M.D.   On: 01/30/2023 17:31        Scheduled Meds:  Chlorhexidine Gluconate Cloth  6 each Topical Daily   doxazosin  4 mg Oral QHS   DULoxetine  20 mg Oral Daily   enoxaparin (LOVENOX) injection  40 mg Subcutaneous Q24H   finasteride  5 mg Oral Daily   memantine  10 mg Oral Daily   pantoprazole  40 mg Oral Daily   pravastatin  40 mg Oral q1800   Continuous Infusions:  sodium chloride Stopped (01/31/23 2121)     LOS: 1 day     Silvano Bilis, MD [facility]   If 7PM-7AM, please contact night-coverage [email or web address] Password [email or web address] 02/01/2023, 1:08 PM

## 2023-02-02 DIAGNOSIS — R531 Weakness: Secondary | ICD-10-CM | POA: Diagnosis not present

## 2023-02-02 LAB — CBC
HCT: 34.6 % — ABNORMAL LOW (ref 39.0–52.0)
Hemoglobin: 11.7 g/dL — ABNORMAL LOW (ref 13.0–17.0)
MCH: 30.1 pg (ref 26.0–34.0)
MCHC: 33.8 g/dL (ref 30.0–36.0)
MCV: 88.9 fL (ref 80.0–100.0)
Platelets: 130 10*3/uL — ABNORMAL LOW (ref 150–400)
RBC: 3.89 MIL/uL — ABNORMAL LOW (ref 4.22–5.81)
RDW: 12.2 % (ref 11.5–15.5)
WBC: 4.3 10*3/uL (ref 4.0–10.5)
nRBC: 0 % (ref 0.0–0.2)

## 2023-02-02 LAB — BASIC METABOLIC PANEL
Anion gap: 7 (ref 5–15)
BUN: 15 mg/dL (ref 8–23)
CO2: 24 mmol/L (ref 22–32)
Calcium: 7.3 mg/dL — ABNORMAL LOW (ref 8.9–10.3)
Chloride: 106 mmol/L (ref 98–111)
Creatinine, Ser: 1 mg/dL (ref 0.61–1.24)
GFR, Estimated: >60 mL/min (ref >60)
Glucose, Bld: 82 mg/dL (ref 70–99)
Potassium: 3.4 mmol/L — ABNORMAL LOW (ref 3.5–5.1)
Sodium: 137 mmol/L (ref 135–145)

## 2023-02-02 LAB — CULTURE, BLOOD (ROUTINE X 2): Special Requests: ADEQUATE

## 2023-02-02 MED ORDER — PAXLOVID (300/100) 20 X 150 MG & 10 X 100MG PO TBPK: ORAL_TABLET | ORAL | 0 refills | Status: DC

## 2023-02-02 NOTE — Discharge Instructions (Signed)
Do not take lovastatin and lisinopril while taking paxlovid. Continue to hold lovastatin for 1 week after finishing paxlovid.

## 2023-02-02 NOTE — Discharge Summary (Signed)
Jeffrey Burke DGL:[id number] DOB: [birth year] DOA: 01/30/2023  PCP: Carolynne Edouard, MD  Admit date: 01/30/2023 Discharge date: 02/02/2023  Time spent: 35 minutes  Recommendations for Outpatient Follow-up:  Pcp f/u     Discharge Diagnoses:  Principal Problem:   Generalized weakness Active Problems:   COVID-19 virus infection   Acute metabolic encephalopathy   Acute urinary retention   Alzheimer disease (HCC)   BPH (benign prostatic hyperplasia)   Essential hypertension   Bilateral sensorineural hearing loss   AKI (acute kidney injury) (HCC)   Gross hematuria   Discharge Condition: improved  Diet recommendation: regular  Filed Weights   01/30/23 1700  Weight: 72.6 kg    History of present illness:  From admission h and p Jeffrey Burke is a 88 y.o. male with medical history significant for Dementia, hearing loss, hypertension who at baseline ambulates independently and is otherwise active who was brought to the ED with a 1 day history of weakness, decreased appetite and staying in his chair.  His son reportedly had difficulty getting him out of his chair and he appeared shaky, which is unusual for him.  He also seemed more confused than baseline.  He has had no fever or chills, cough or shortness of breath, vomiting, diarrhea or complaints of pain.   Hospital Course:  Patient with history advanced dementia presents with one day weakness and worsening confusion. Found to be febrile and covid positive. Admitted for covid encephalopathy. This improved over the course of his hospital stay. Tolerating diet, PT evaluated and advises home health PT which we have ordered. He was found to have acute urinary retention on arrival (had not urinated that afternoon or evening or in the ED and was retaining 450 ml). Foley was placed. On hospital day 1 gross hematuria noted, likely traumatic from foley placement or patient's attempts to remove. Case discussed w/ urology who advised d/c  foley and CIC as needed. Fortunately patient able now to void spontaneously and hematuria is much improved. Family will monitor. Will rx paxlovid patient's mild covid with symptom onset within the past 5 days.   Procedures: none   Consultations: none  Discharge Exam: Vitals:   02/02/23 0854 02/02/23 0857  BP: (!) 125/101 121/67  Pulse: 73   Resp: 18   Temp: 97.9 F (36.6 C)   SpO2: 97%     General: NAD Cardiovascular: RRR Respiratory: CTAB  Discharge Instructions   Discharge Instructions     Diet - low sodium heart healthy   Complete by: As directed    Increase activity slowly   Complete by: As directed       Allergies as of 02/02/2023   No Known Allergies      Medication List     TAKE these medications    doxazosin 4 MG tablet Commonly known as: CARDURA Take 4 mg by mouth at bedtime.   DULoxetine 20 MG capsule Commonly known as: CYMBALTA Take 20 mg by mouth daily.   finasteride 5 MG tablet Commonly known as: PROSCAR Take 5 mg by mouth daily.   gabapentin 100 MG capsule Commonly known as: NEURONTIN Take 100 mg by mouth at bedtime.   lisinopril 5 MG tablet Commonly known as: ZESTRIL Take 5 mg by mouth daily.   lovastatin 40 MG tablet Commonly known as: MEVACOR Take 40 mg by mouth at bedtime.   memantine 10 MG tablet Commonly known as: NAMENDA Take 10 mg by mouth daily.   omeprazole 40 MG capsule Commonly known as:  PRILOSEC Take 40 mg by mouth daily.   Paxlovid (300/100) 20 x 150 MG & 10 x 100MG  Tbpk Generic drug: nirmatrelvir & ritonavir Nirmatrelvir 300 mg with ritonavir 100 mg take together twice daily for 5 days (standard dosing)       No Known Allergies  Follow-up Information     Hemming, Jimmy Picket, MD Follow up.   Specialty: Internal Medicine Contact information: [address] [place] [address] [phone]                  The results of significant diagnostics from this hospitalization (including imaging,  microbiology, ancillary and laboratory) are listed below for reference.    Significant Diagnostic Studies: DG Chest Portable 1 View  Result Date: 01/30/2023 CLINICAL DATA:  Shortness of breath EXAM: PORTABLE CHEST 1 VIEW COMPARISON:  None Available. FINDINGS: The heart size and mediastinal contours are within normal limits. Aortic atherosclerosis. Both lungs are clear. The visualized skeletal structures are unremarkable. IMPRESSION: No active disease. Electronically Signed   By: Jasmine Pang M.D.   On: 01/30/2023 23:43   CT HEAD WO CONTRAST  Result Date: 01/30/2023 CLINICAL DATA:  Altered speech and difficulty walking. EXAM: CT HEAD WITHOUT CONTRAST TECHNIQUE: Contiguous axial images were obtained from the base of the skull through the vertex without intravenous contrast. RADIATION DOSE REDUCTION: This exam was performed according to the departmental dose-optimization program which includes automated exposure control, adjustment of the mA and/or kV according to patient size and/or use of iterative reconstruction technique. COMPARISON:  July 15, 2019 FINDINGS: Brain: There is mild to moderate severity cerebral atrophy with widening of the extra-axial spaces and ventricular dilatation. There are areas of decreased attenuation within the white matter tracts of the supratentorial brain, consistent with microvascular disease changes. Vascular: No hyperdense vessel or unexpected calcification. Skull: Normal. Negative for fracture or focal lesion. Sinuses/Orbits: No acute finding. Other: None. IMPRESSION: 1. No acute intracranial abnormality. 2. Generalized cerebral atrophy and microvascular disease changes of the supratentorial brain. Electronically Signed   By: Aram Candela M.D.   On: 01/30/2023 17:31    Microbiology: Recent Results (from the past 240 hour(s))  Urine Culture     Status: None   Collection Time: 01/31/23 12:05 AM   Specimen: Urine, Clean Catch  Result Value Ref Range Status    Specimen Description   Final    URINE, CLEAN CATCH Performed at [facility], [address] [place], [place] [address]    Special Requests   Final    NONE Performed at [facility], [address] [place], [place] [address]    Culture   Final    NO GROWTH Performed at [facility], [address] [place], [place] [address]    Report Status 02/01/2023 FINAL  Final  Blood culture (routine x 2)     Status: None (Preliminary result)   Collection Time: 01/31/23 12:34 AM   Specimen: BLOOD  Result Value Ref Range Status   Specimen Description BLOOD  LAC  Final   Special Requests   Final    BOTTLES DRAWN AEROBIC AND ANAEROBIC Blood Culture adequate volume   Culture   Final    NO GROWTH 2 DAYS Performed at [facility], [address] [place], [place] [address]    Report Status PENDING  Incomplete  Resp panel by RT-PCR (RSV, Flu A&B, Covid) Anterior Nasal Swab     Status: Abnormal   Collection Time: 01/31/23 12:35 AM   Specimen:  Anterior Nasal Swab  Result Value Ref Range Status   SARS Coronavirus 2 by RT PCR POSITIVE (A) NEGATIVE Final    Comment: (NOTE) SARS-CoV-2 target nucleic acids are DETECTED.  The SARS-CoV-2 RNA is generally detectable in upper respiratory specimens during the acute phase of infection. Positive results are indicative of the presence of the identified virus, but do not rule out bacterial infection or co-infection with other pathogens not detected by the test. Clinical correlation with patient history and other diagnostic information is necessary to determine patient infection status. The expected result is Negative.  Fact Sheet for Patients: BloggerCourse.com  Fact Sheet for Healthcare Providers: SeriousBroker.it  This test is not yet approved or cleared by the [place] FDA and  has been authorized for detection and/or diagnosis of SARS-CoV-2 by FDA under an  Emergency Use Authorization (EUA).  This EUA will remain in effect (meaning this test can be used) for the duration of  the COVID-19 declaration under Section 564(b)(1) of the A ct, 21 U.S.C. section 360bbb-3(b)(1), unless the authorization is terminated or revoked sooner.     Influenza A by PCR NEGATIVE NEGATIVE Final   Influenza B by PCR NEGATIVE NEGATIVE Final    Comment: (NOTE) The Xpert Xpress SARS-CoV-2/FLU/RSV plus assay is intended as an aid in the diagnosis of influenza from Nasopharyngeal swab specimens and should not be used as a sole basis for treatment. Nasal washings and aspirates are unacceptable for Xpert Xpress SARS-CoV-2/FLU/RSV testing.  Fact Sheet for Patients: BloggerCourse.com  Fact Sheet for Healthcare Providers: SeriousBroker.it  This test is not yet approved or cleared by the [place] FDA and has been authorized for detection and/or diagnosis of SARS-CoV-2 by FDA under an Emergency Use Authorization (EUA). This EUA will remain in effect (meaning this test can be used) for the duration of the COVID-19 declaration under Section 564(b)(1) of the Act, 21 U.S.C. section 360bbb-3(b)(1), unless the authorization is terminated or revoked.     Resp Syncytial Virus by PCR NEGATIVE NEGATIVE Final    Comment: (NOTE) Fact Sheet for Patients: BloggerCourse.com  Fact Sheet for Healthcare Providers: SeriousBroker.it  This test is not yet approved or cleared by the [place] FDA and has been authorized for detection and/or diagnosis of SARS-CoV-2 by FDA under an Emergency Use Authorization (EUA). This EUA will remain in effect (meaning this test can be used) for the duration of the COVID-19 declaration under Section 564(b)(1) of the Act, 21 U.S.C. section 360bbb-3(b)(1), unless the authorization is terminated or revoked.  Performed at [facility],  [address] [place], [place] [address]   Blood culture (routine x 2)     Status: None (Preliminary result)   Collection Time: 01/31/23 12:35 AM   Specimen: BLOOD  Result Value Ref Range Status   Specimen Description BLOOD  LFA  Final   Special Requests   Final    BOTTLES DRAWN AEROBIC AND ANAEROBIC Blood Culture adequate volume   Culture   Final    NO GROWTH 2 DAYS Performed at [facility], [address], [place], [place] [address]    Report Status PENDING  Incomplete     Labs: Basic Metabolic Panel: Recent Labs  Lab 01/30/23 1703 02/01/23 0612 02/02/23 0503  NA 134* 136 137  K 4.0 3.6 3.4*  CL 105 107 106  CO2 23 24 24   GLUCOSE 95 85 82  BUN 25* 18 15  CREATININE 1.26* 1.09 1.00  CALCIUM 7.9* 7.4* 7.3*   Liver Function Tests:  Recent Labs  Lab 01/30/23 1703  AST 22  ALT 17  ALKPHOS 60  BILITOT 0.7  PROT 6.4*  ALBUMIN 3.9   No results for input(s): "LIPASE", "AMYLASE" in the last 168 hours. No results for input(s): "AMMONIA" in the last 168 hours. CBC: Recent Labs  Lab 01/30/23 1703 02/02/23 0503  WBC 6.0 4.3  NEUTROABS 4.3  --   HGB 13.2 11.7*  HCT 40.3 34.6*  MCV 90.8 88.9  PLT 175 130*   Cardiac Enzymes: No results for input(s): "CKTOTAL", "CKMB", "CKMBINDEX", "TROPONINI" in the last 168 hours. BNP: BNP (last 3 results) No results for input(s): "BNP" in the last 8760 hours.  ProBNP (last 3 results) No results for input(s): "PROBNP" in the last 8760 hours.  CBG: Recent Labs  Lab 01/30/23 1657  GLUCAP 93       Signed:  Silvano Bilis MD.  [facility] 02/02/2023, 12:06 PM

## 2023-02-02 NOTE — TOC Initial Note (Signed)
Transition of Care [facility]) - Initial/Assessment Note    Patient Details  Name: Jeffrey Burke MRN: [id number] Date of Birth: 05-24-1935  [facility] CM/SW Contact:    Bing Quarry, RN Phone Number: 02/02/2023, 12:23 PM  Clinical Narrative: 5/25: Discharge orders in for today, notified [facility] via [facility]. Per prior CSW note, patient has needed DME, private care services with 24/7 care at home.  Registered Dietician consult completed with recommendations for nutritional support on 02/01/24. To follow up with PCP, Dr Alda Lea per provider discharge instructions.  Admitted with weakness, AMS, and was Covid +. Please reach out to [facility] if other needs arrived prior to discharge from [facility]. Gabriel Cirri RN CM               Expected Discharge Plan: Home w Home Health Services Barriers to Discharge: Barriers Resolved   Patient Goals and CMS Choice Patient states their goals for this hospitalization and ongoing recovery are:: to go home CMS Medicare.gov Compare Post Acute Care list provided to:: Patient Represenative (must comment) (son)        Expected Discharge Plan and Services       Living arrangements for the past 2 months: Single Family Home Expected Discharge Date: 02/02/23               DME Arranged: N/A DME Agency: NA       HH Arranged: PT HH Agency: [facility]        Prior Living Arrangements/Services Living arrangements for the past 2 months: Single Family Home Lives with:: Adult Children                   Activities of Daily Living Home Assistive Devices/Equipment: Other (Comment) ADL Screening (condition at time of admission) Patient's cognitive ability adequate to safely complete daily activities?: No Is the patient deaf or have difficulty hearing?: No Does the patient have difficulty seeing, even when wearing glasses/contacts?: No Does the patient have difficulty concentrating, remembering, or making decisions?: No Patient able to  express need for assistance with ADLs?: No Does the patient have difficulty dressing or bathing?: Yes Independently performs ADLs?: No Communication: Independent Is this a change from baseline?:  (UTA) Dressing (OT): Needs assistance Is this a change from baseline?:  (UTA) Grooming: Needs assistance Is this a change from baseline?:  (UTA) Feeding: Independent Bathing: Needs assistance Is this a change from baseline?:  (UTA) Toileting: Needs assistance Is this a change from baseline?:  (UTA) In/Out Bed: Needs assistance Is this a change from baseline?:  (UTA) Walks in Home:  (UTA) Does the patient have difficulty walking or climbing stairs?: Yes Weakness of Legs: None Weakness of Arms/Hands: None  Permission Sought/Granted                  Emotional Assessment              Admission diagnosis:  Weakness [R53.1] Generalized weakness [R53.1] COVID-19 [U07.1] Patient Active Problem List   Diagnosis Date Noted   Gross hematuria 02/01/2023   Generalized weakness 01/31/2023   Alzheimer disease (HCC) 01/31/2023   BPH (benign prostatic hyperplasia) 01/31/2023   COVID-19 virus infection 01/31/2023   Acute metabolic encephalopathy 01/31/2023   Acute urinary retention 01/31/2023   AKI (acute kidney injury) (HCC) 01/31/2023   Bilateral sensorineural hearing loss 05/09/2020   Essential hypertension 07/21/2019   PCP:  Carolynne Edouard, MD Pharmacy:   [facility] [phone] - [place], [place] - [phone]  [facility] [address] AT [facility] [address] [place] [address] [facility] [place] [address] Phone: [phone] Fax: [phone]     Social Determinants of Health (SDOH) Social History: SDOH Screenings   Food Insecurity: Patient Unable To Answer (02/01/2023)  Housing: Patient Unable To Answer (02/01/2023)  Transportation Needs: Patient Unable To Answer (02/01/2023)  Utilities: Patient Unable To Answer (02/01/2023)  Tobacco Use: Low Risk  (07/22/2019)   SDOH  Interventions:     Readmission Risk Interventions     No data to display

## 2023-02-03 LAB — CULTURE, BLOOD (ROUTINE X 2): Culture: NO GROWTH

## 2023-02-04 LAB — CULTURE, BLOOD (ROUTINE X 2)

## 2023-02-05 LAB — CULTURE, BLOOD (ROUTINE X 2)

## 2023-08-15 ENCOUNTER — Encounter: Payer: Self-pay | Admitting: Emergency Medicine

## 2023-08-15 ENCOUNTER — Ambulatory Visit: Payer: Medicare HMO

## 2023-08-15 ENCOUNTER — Ambulatory Visit
Admission: EM | Admit: 2023-08-15 | Discharge: 2023-08-15 | Disposition: A | Discharge status: Home / Self Care | Payer: Medicare HMO | Attending: Emergency Medicine | Admitting: Emergency Medicine

## 2023-08-15 DIAGNOSIS — J069 Acute upper respiratory infection, unspecified: Secondary | ICD-10-CM | POA: Diagnosis present

## 2023-08-15 LAB — SARS CORONAVIRUS 2 BY RT PCR: SARS Coronavirus 2 by RT PCR: NEGATIVE

## 2023-08-15 MED ORDER — BENZONATATE 100 MG PO CAPS: 200.0000 mg | ORAL_CAPSULE | Freq: Three times a day (TID) | ORAL | 0 refills | Status: DC

## 2023-08-15 MED ORDER — IPRATROPIUM BROMIDE 0.06 % NA SOLN: 2.0000 | Freq: Four times a day (QID) | NASAL | 12 refills | Status: DC

## 2023-08-15 NOTE — ED Triage Notes (Signed)
Pt presents with his daughter in law who states patient has a cough, runny nose and wheezing x 2 days

## 2023-08-15 NOTE — ED Provider Notes (Addendum)
[facility]    CSN: [id number] Arrival date & time: 08/15/23  1714      History   Chief Complaint Chief Complaint  Patient presents with   Nasal Congestion   Cough   Wheezing    HPI Jeffrey Burke is a 88 y.o. male.   HPI  88 year old male with a past medical history significant for neuropathy, hypercholesterolemia, BPH, and Alzheimer's disease presents for evaluation of 2 days worth of respiratory symptoms.  Patient is with his daughter-in-law who reports that during the day he is with a caregiver.  When the daughter-in-law arrived home from work she noticed that he had an increased work of breathing so she brought him in for evaluation.  She denies any fever, known sick contacts, or GI symptoms.  He has had a bit of a runny nose and a nonproductive cough.  Also some wheezing.  Past Medical History:  Diagnosis Date   Alzheimer disease (HCC)    BPH (benign prostatic hyperplasia)    Hypercholesteremia    Neuropathy     Patient Active Problem List   Diagnosis Date Noted   Gross hematuria 02/01/2023   Generalized weakness 01/31/2023   Alzheimer disease (HCC) 01/31/2023   BPH (benign prostatic hyperplasia) 01/31/2023   COVID-19 virus infection 01/31/2023   Acute metabolic encephalopathy 01/31/2023   Acute urinary retention 01/31/2023   AKI (acute kidney injury) (HCC) 01/31/2023   Bilateral sensorineural hearing loss 05/09/2020   Essential hypertension 07/21/2019    History reviewed. No pertinent surgical history.     Home Medications    Prior to Admission medications   Medication Sig Start Date End Date Taking? Authorizing Provider  benzonatate (TESSALON) 100 MG capsule Take 2 capsules (200 mg total) by mouth every 8 (eight) hours. 08/15/23  Yes Becky Augusta, NP  ipratropium (ATROVENT) 0.06 % nasal spray Place 2 sprays into both nostrils 4 (four) times daily. 08/15/23  Yes Becky Augusta, NP  doxazosin (CARDURA) 4 MG tablet Take 4 mg by mouth at bedtime.  06/11/19   [provider]  DULoxetine (CYMBALTA) 20 MG capsule Take 20 mg by mouth daily. 12/13/22   [provider]  finasteride (PROSCAR) 5 MG tablet Take 5 mg by mouth daily. 06/11/19   [provider]  gabapentin (NEURONTIN) 100 MG capsule Take 100 mg by mouth at bedtime. 06/11/19   [provider]  lisinopril (ZESTRIL) 5 MG tablet Take 5 mg by mouth daily.    [provider]  lovastatin (MEVACOR) 40 MG tablet Take 40 mg by mouth at bedtime. 06/11/19   [provider]  memantine (NAMENDA) 10 MG tablet Take 10 mg by mouth daily. 06/11/19   [provider]  omeprazole (PRILOSEC) 40 MG capsule Take 40 mg by mouth daily. 12/13/22   [provider]    Family History Family History  Problem Relation Age of Onset   Stroke Mother    Heart failure Father     Social History Social History   Tobacco Use   Smoking status: Never   Smokeless tobacco: Never  Vaping Use   Vaping status: Never Used  Substance Use Topics   Alcohol use: Never   Drug use: Never     Allergies   Patient has no known allergies.   Review of Systems Review of Systems  Constitutional:  Negative for fever.  HENT:  Positive for congestion and rhinorrhea.   Respiratory:  Positive for cough, shortness of breath and wheezing.   Gastrointestinal:  Negative  for diarrhea, nausea and vomiting.     Physical Exam Triage Vital Signs ED Triage Vitals  Encounter Vitals Group     BP      Systolic BP Percentile      Diastolic BP Percentile      Pulse      Resp      Temp      Temp src      SpO2      Weight      Height      Head Circumference      Peak Flow      Pain Score      Pain Loc      Pain Education      Exclude from Growth Chart    No data found.  Updated Vital Signs BP (!) 146/70 (BP Location: Left Arm)   Pulse 79   Temp 97.8 F (36.6 C) (Oral)   Resp 16   SpO2 95%   Visual Acuity Right Eye Distance:   Left Eye Distance:    Bilateral Distance:    Right Eye Near:   Left Eye Near:    Bilateral Near:     Physical Exam Vitals and nursing note reviewed.  Constitutional:      Appearance: Normal appearance. He is not ill-appearing.  HENT:     Head: Normocephalic and atraumatic.     Right Ear: Tympanic membrane, ear canal and external ear normal. There is no impacted cerumen.     Left Ear: Tympanic membrane, ear canal and external ear normal. There is no impacted cerumen.     Nose: Congestion and rhinorrhea present.     Comments: This mucosa is mildly edematous with clear discharge in both nares.    Mouth/Throat:     Mouth: Mucous membranes are moist.     Pharynx: Oropharynx is clear. No oropharyngeal exudate or posterior oropharyngeal erythema.  Cardiovascular:     Rate and Rhythm: Normal rate and regular rhythm.     Pulses: Normal pulses.     Heart sounds: Normal heart sounds. No murmur heard.    No friction rub. No gallop.  Pulmonary:     Effort: Pulmonary effort is normal.     Breath sounds: Normal breath sounds. No wheezing, rhonchi or rales.  Musculoskeletal:     Cervical back: Normal range of motion and neck supple.  Lymphadenopathy:     Cervical: No cervical adenopathy.  Skin:    General: Skin is warm and dry.     Findings: No erythema or rash.  Neurological:     Mental Status: He is alert.      UC Treatments / Results  Labs (all labs ordered are listed, but only abnormal results are displayed) Labs Reviewed  SARS CORONAVIRUS 2 BY RT PCR    EKG   Radiology No results found.  Procedures Procedures (including critical care time)  Medications Ordered in UC Medications - No data to display  Initial Impression / Assessment and Plan / UC Course  I have reviewed the triage vital signs and the nursing notes.  Pertinent labs & imaging results that were available during my care of the patient were reviewed by me and considered in my medical decision making (see chart for  details).   Patient is a nontoxic-appearing 87 year old male with a history of Alzheimer disease presenting for evaluation 2 days with the respiratory symptoms as outlined HPI above.  The daughter-in-law reports that she he noticed he had an increased work of  breathing when she got home from work tonight.  He does not have any increased work of breathing in the exam room and he is not using any accessory muscles.  His lung sounds are diffusely decreased though he will not take a deep breath to allow for good auscultation.  I will order a COVID PCR as well as a chest x-ray to evaluate for any acute cardiopulmonary pathology.  Chest x-ray independently reviewed and evaluated by me.  Impression: No evidence of infiltrate or effusion.  Cardiomediastinal silhouette appears normal.  Radiology overread is pending. Radiology impression states no active cardiopulmonary findings.  COVID PCR is negative.  I will discharge patient home with a diagnosis of viral URI with a cough with prescription for Atrovent nasal spray to open nasal congestion and Tessalon Perles.  Take with cough and congestion.   Final Clinical Impressions(s) / UC Diagnoses   Final diagnoses:  Viral URI with cough     Discharge Instructions      The COVID test today was negative and the chest x-ray did not show any evidence of pneumonia.  I do believe that you have a viral respiratory infection.  Use the Atrovent nasal spray, 2 squirts up each nostril every 6 hours, as needed for runny nose and nasal congestion.  Give the [employer] every 8 hours as needed for cough.  Give them with a small sip of water.  This may cause numbness to the base of the tongue or metallic taste in the mouth, this is normal.  You may also use over-the-counter Delsym, Robitussin, or Zarbee's according to the package instructions as needed for cough and congestion.  If any worsening work of breathing, audible wheezing, fevers not responding to  over-the-counter Tylenol, or other worrisome symptoms present themselves I recommend you call 911 and go to the ER for reevaluation.     ED Prescriptions     Medication Sig Dispense Auth. Provider   ipratropium (ATROVENT) 0.06 % nasal spray Place 2 sprays into both nostrils 4 (four) times daily. 15 mL Becky Augusta, NP   benzonatate (TESSALON) 100 MG capsule Take 2 capsules (200 mg total) by mouth every 8 (eight) hours. 21 capsule Becky Augusta, NP      PDMP not reviewed this encounter.   Becky Augusta, NP 08/15/23 1851    Becky Augusta, NP 08/15/23 1939

## 2023-08-15 NOTE — Discharge Instructions (Addendum)
The COVID test today was negative and the chest x-ray did not show any evidence of pneumonia.  I do believe that you have a viral respiratory infection.  Use the Atrovent nasal spray, 2 squirts up each nostril every 6 hours, as needed for runny nose and nasal congestion.  Give the [employer] every 8 hours as needed for cough.  Give them with a small sip of water.  This may cause numbness to the base of the tongue or metallic taste in the mouth, this is normal.  You may also use over-the-counter Delsym, Robitussin, or Zarbee's according to the package instructions as needed for cough and congestion.  If any worsening work of breathing, audible wheezing, fevers not responding to over-the-counter Tylenol, or other worrisome symptoms present themselves I recommend you call 911 and go to the ER for reevaluation.

## 2024-07-16 ENCOUNTER — Ambulatory Visit
Admission: EM | Admit: 2024-07-16 | Discharge: 2024-07-16 | Disposition: A | Discharge status: Home / Self Care | Attending: Family Medicine | Admitting: Family Medicine

## 2024-07-16 DIAGNOSIS — W19XXXA Unspecified fall, initial encounter: Secondary | ICD-10-CM

## 2024-07-16 DIAGNOSIS — S0990XA Unspecified injury of head, initial encounter: Secondary | ICD-10-CM

## 2024-07-16 DIAGNOSIS — S99921A Unspecified injury of right foot, initial encounter: Secondary | ICD-10-CM

## 2024-07-16 DIAGNOSIS — S99911A Unspecified injury of right ankle, initial encounter: Secondary | ICD-10-CM

## 2024-07-16 NOTE — ED Notes (Signed)
 Patient is being discharged from the Urgent Care and sent to the Emergency Department via PV with caretakers to [facility] . Per provider ALONSO Porteous, patient is in need of higher level of care due to fall. Patient is aware and verbalizes understanding of plan of care.  Vitals:   07/16/24 0848  BP: 120/61  Pulse: 95  Resp: 18  Temp: 97.9 F (36.6 C)  SpO2: 96%

## 2024-07-16 NOTE — ED Triage Notes (Signed)
 Pt caretaker states pt was up at 4am and they heard a loud noise and noticed a bump on his head and swelling and pain in his right ankle.

## 2024-07-16 NOTE — ED Provider Notes (Signed)
 [facility]    CSN: [id number] Arrival date & time: 07/16/24  9160      History   Chief Complaint Chief Complaint  Patient presents with   Ankle Pain    HPI Jeffrey Burke is a 89 y.o. male.   HPI   Echo presents  after a unwitnessed fall at home around 4 AM this morning. His caretaker heard him in the bathroom around 430 AM.  But when she checked him around 8 AM she found him in the bathroom fumbling around with stuff. Took socks off and saw that his foot was swollen.  They ate breakfast and she noticed he had a knot on his forehead. He doesn't recall the events as he has dementia. Says he feels bad and points to his right foot and ankle.    Family is out of town for a funeral. He is more confused for 2 weeks while his son has been out of town.  Has and daytime and night time sitting.   No recent fever.  No new cough.     Past Medical History:  Diagnosis Date   Alzheimer disease (HCC)    BPH (benign prostatic hyperplasia)    Hypercholesteremia    Neuropathy     Patient Active Problem List   Diagnosis Date Noted   Gross hematuria 02/01/2023   Generalized weakness 01/31/2023   Alzheimer disease (HCC) 01/31/2023   BPH (benign prostatic hyperplasia) 01/31/2023   COVID-19 virus infection 01/31/2023   Acute metabolic encephalopathy 01/31/2023   Acute urinary retention 01/31/2023   AKI (acute kidney injury) 01/31/2023   Bilateral sensorineural hearing loss 05/09/2020   Essential hypertension 07/21/2019    History reviewed. No pertinent surgical history.     Home Medications    Prior to Admission medications   Medication Sig Start Date End Date Taking? Authorizing Provider  doxazosin  (CARDURA ) 4 MG tablet Take 4 mg by mouth at bedtime. 06/11/19  Yes [provider]  DULoxetine  (CYMBALTA ) 20 MG capsule Take 20 mg by mouth daily. 12/13/22  Yes [provider]  finasteride  (PROSCAR ) 5 MG tablet Take 5 mg by mouth daily. 06/11/19  Yes  [provider]  lisinopril  (ZESTRIL ) 5 MG tablet Take 5 mg by mouth daily.   Yes [provider]  lovastatin (MEVACOR) 40 MG tablet Take 40 mg by mouth at bedtime. 06/11/19  Yes [provider]  memantine  (NAMENDA ) 10 MG tablet Take 10 mg by mouth daily. 06/11/19  Yes [provider]  omeprazole (PRILOSEC) 40 MG capsule Take 40 mg by mouth daily. 12/13/22  Yes [provider]  gabapentin (NEURONTIN) 100 MG capsule Take 100 mg by mouth at bedtime. 06/11/19   [provider]    Family History Family History  Problem Relation Age of Onset   Stroke Mother    Heart failure Father     Social History Social History   Tobacco Use   Smoking status: Never   Smokeless tobacco: Never  Vaping Use   Vaping status: Never Used  Substance Use Topics   Alcohol use: Never   Drug use: Never     Allergies   Patient has no known allergies.   Review of Systems Review of Systems: negative unless otherwise stated in HPI.      Physical Exam Triage Vital Signs ED Triage Vitals [07/16/24 0848]  Encounter Vitals Group     BP 120/61     Girls Systolic BP Percentile      Girls Diastolic  BP Percentile      Boys Systolic BP Percentile      Boys Diastolic BP Percentile      Pulse Rate 95     Resp 18     Temp 97.9 F (36.6 C)     Temp Source Oral     SpO2 96 %     Weight      Height      Head Circumference      Peak Flow      Pain Score      Pain Loc      Pain Education      Exclude from Growth Chart    No data found.  Updated Vital Signs BP 120/61 (BP Location: Left Arm)   Pulse 95   Temp 97.9 F (36.6 C) (Oral)   Resp 18   SpO2 96%   Visual Acuity Right Eye Distance:   Left Eye Distance:   Bilateral Distance:    Right Eye Near:   Left Eye Near:    Bilateral Near:     Physical Exam GEN: Alert, male in no acute distress  EYES: Extraocular movements intact, pupils equal round and reactive to light HENT: Moist mucous  membranes, no oropharyngeal lesions, no blood visble, no hemotympanum,  left frontal hematoma with overlying abrasion NECK: Normal range of motion, no cervical spinous tenderness,  no paraspinal tenderness bilaterally CV: regular rate and rhythm RESP: no increased work of breathing, clear to ascultation bilaterally MSK: No extremity edema or deformities Ankle/Foot, Right: TTP noted at all the toes, bilaterally malleoli, distal anterior tibia and fibula. +swelling, ecchymosis. No visible erythema or bony deformity. No notable pes planus/cavus deformity. No evidence of tibiotalar deviation; Range of motion is full in all directions. Strength is 5/5 in all directions. No tenderness at the insertion/body/myotendinous junction of the Achilles tendon; Talar dome non-tender; Unremarkable calcaneal squeeze; +pain at base of 5th MT ;Tenderness at the distal metatarsals; Unable to walk 4 steps.  SKIN: warm, dry, no abrasions NEURO: alert, moves all extremities appropriately, strength 4/5 bilateral upper, alert, normal speech     UC Treatments / Results  Labs (all labs ordered are listed, but only abnormal results are displayed) Labs Reviewed - No data to display  EKG   Radiology No results found.  Procedures Procedures (including critical care time)  Medications Ordered in UC Medications - No data to display  Initial Impression / Assessment and Plan / UC Course  I have reviewed the triage vital signs and the nursing notes.  Pertinent labs & imaging results that were available during my care of the patient were reviewed by me and considered in my medical decision making (see chart for details).       Patient is a 89 y.o. male with Alzheimer's disease, hearing loss, hypertension, hypercholesterolemia, neuropathy and BPH presents after a unwitnessed fall this morning at home who presents for head injury and right foot with ankle pain and swelling.   Patient had evidence of head trauma  after a fall < 6 hours ago.  He has evidence of right foot and ankle injury with diffuse tenderness on exam. Given his unwitnessed fall he likely needs a Head CT that is not available here at the urgent care. Recommended ED evaluation. EMS offered but declined by caregivers. They would like to take him to [facility] via private vehicle.   Discussed MDM, treatment plan and plan for follow-up with patient/parent who agrees with plan.     Final  Clinical Impressions(s) / UC Diagnoses   Final diagnoses:  Injury of ankle and foot, right, initial encounter  Injury of head, initial encounter  Fall, initial encounter     Discharge Instructions      You have been advised to follow up immediately in the emergency department for concerning signs or symptoms as discussed during your visit. If you declined EMS transport, please have a family member take you directly to the ED at this time. Do not delay.   Based on concerns about condition, if you do not follow up in the ED, you may risk poor outcomes including worsening of condition, delayed treatment and potentially life threatening issues. If you have declined to go to the ED at this time, you should call your PCP immediately to set up a follow up appointment.   Go to ED for red flag symptoms, including; fevers you cannot reduce with Tylenol /Motrin, severe headaches, vision changes, numbness/weakness in part of the body, lethargy, confusion, intractable vomiting, severe dehydration, chest pain, breathing difficulty, severe persistent abdominal or pelvic pain, signs of severe infection (increased redness, swelling of an area), feeling faint or passing out, dizziness, etc. You should especially go to the ED for sudden acute worsening of condition if you do not elect to go at this time.       ED Prescriptions   None    PDMP not reviewed this encounter.   Kinslea Frances, DO 07/16/24 1113

## 2024-07-16 NOTE — Discharge Instructions (Addendum)
